# Patient Record
Sex: Male | Born: 1990 | Hispanic: Yes | Marital: Single | Smoking: Never smoker
Health system: Southern US, Community
[De-identification: ages and names within clinical notes are randomized; demographics above are authoritative.]

## PROBLEM LIST (undated history)

## (undated) DIAGNOSIS — F319 Bipolar disorder, unspecified: Secondary | ICD-10-CM

---

## 2019-01-20 ENCOUNTER — Emergency Department (HOSPITAL_COMMUNITY)
Admission: EM | Admit: 2019-01-20 | Discharge: 2019-01-21 | Disposition: A | Discharge status: Home / Self Care | Payer: Medicaid Other | Attending: Emergency Medicine | Admitting: Emergency Medicine

## 2019-01-20 ENCOUNTER — Other Ambulatory Visit: Payer: Self-pay

## 2019-01-20 ENCOUNTER — Ambulatory Visit (HOSPITAL_COMMUNITY)
Admission: RE | Admit: 2019-01-20 | Discharge: 2019-01-20 | Disposition: A | Discharge status: Home / Self Care | Payer: Medicaid Other | Attending: Psychiatry | Admitting: Psychiatry

## 2019-01-20 ENCOUNTER — Encounter (HOSPITAL_COMMUNITY): Payer: Self-pay | Admitting: Emergency Medicine

## 2019-01-20 DIAGNOSIS — R002 Palpitations: Secondary | ICD-10-CM | POA: Diagnosis not present

## 2019-01-20 DIAGNOSIS — Z046 Encounter for general psychiatric examination, requested by authority: Secondary | ICD-10-CM | POA: Diagnosis present

## 2019-01-20 DIAGNOSIS — F319 Bipolar disorder, unspecified: Secondary | ICD-10-CM | POA: Diagnosis not present

## 2019-01-20 DIAGNOSIS — R4689 Other symptoms and signs involving appearance and behavior: Secondary | ICD-10-CM | POA: Diagnosis not present

## 2019-01-20 DIAGNOSIS — F1994 Other psychoactive substance use, unspecified with psychoactive substance-induced mood disorder: Secondary | ICD-10-CM | POA: Diagnosis not present

## 2019-01-20 DIAGNOSIS — F419 Anxiety disorder, unspecified: Secondary | ICD-10-CM | POA: Insufficient documentation

## 2019-01-20 DIAGNOSIS — Z20828 Contact with and (suspected) exposure to other viral communicable diseases: Secondary | ICD-10-CM | POA: Diagnosis not present

## 2019-01-20 DIAGNOSIS — F39 Unspecified mood [affective] disorder: Secondary | ICD-10-CM | POA: Insufficient documentation

## 2019-01-20 HISTORY — DX: Bipolar disorder, unspecified: F31.9

## 2019-01-20 LAB — COMPREHENSIVE METABOLIC PANEL
ALT: 18 U/L (ref 0–44)
AST: 22 U/L (ref 15–41)
Albumin: 4.4 g/dL (ref 3.5–5.0)
Alkaline Phosphatase: 67 U/L (ref 38–126)
Anion gap: 11 (ref 5–15)
BUN: 25 mg/dL — ABNORMAL HIGH (ref 6–20)
CO2: 26 mmol/L (ref 22–32)
Calcium: 9.5 mg/dL (ref 8.9–10.3)
Chloride: 103 mmol/L (ref 98–111)
Creatinine, Ser: 1.02 mg/dL (ref 0.61–1.24)
GFR calc Af Amer: >60 mL/min (ref >60)
GFR calc non Af Amer: >60 mL/min (ref >60)
Glucose, Bld: 105 mg/dL — ABNORMAL HIGH (ref 70–99)
Potassium: 3.8 mmol/L (ref 3.5–5.1)
Sodium: 140 mmol/L (ref 135–145)
Total Bilirubin: 0.3 mg/dL (ref 0.3–1.2)
Total Protein: 7.7 g/dL (ref 6.5–8.1)

## 2019-01-20 LAB — CBC
HCT: 42.8 % (ref 39.0–52.0)
Hemoglobin: 13.9 g/dL (ref 13.0–17.0)
MCH: 29 pg (ref 26.0–34.0)
MCHC: 32.5 g/dL (ref 30.0–36.0)
MCV: 89.4 fL (ref 80.0–100.0)
Platelets: 293 10*3/uL (ref 150–400)
RBC: 4.79 MIL/uL (ref 4.22–5.81)
RDW: 12.8 % (ref 11.5–15.5)
WBC: 9.9 10*3/uL (ref 4.0–10.5)
nRBC: 0 % (ref 0.0–0.2)

## 2019-01-20 LAB — SARS CORONAVIRUS 2 BY RT PCR (HOSPITAL ORDER, PERFORMED IN ~~LOC~~ HOSPITAL LAB): SARS Coronavirus 2: NEGATIVE

## 2019-01-20 LAB — RAPID URINE DRUG SCREEN, HOSP PERFORMED
Amphetamines: NOT DETECTED
Barbiturates: NOT DETECTED
Benzodiazepines: NOT DETECTED
Cocaine: NOT DETECTED
Opiates: NOT DETECTED
Tetrahydrocannabinol: POSITIVE — AB

## 2019-01-20 LAB — ETHANOL: Alcohol, Ethyl (B): <10 mg/dL (ref <10)

## 2019-01-20 LAB — ACETAMINOPHEN LEVEL: Acetaminophen (Tylenol), Serum: <10 ug/mL — ABNORMAL LOW (ref 10–30)

## 2019-01-20 LAB — SALICYLATE LEVEL: Salicylate Lvl: <7 mg/dL (ref 2.8–30.0)

## 2019-01-20 MED ORDER — ALUM & MAG HYDROXIDE-SIMETH 200-200-20 MG/5ML PO SUSP: 30.0000 mL | Freq: Four times a day (QID) | ORAL | Status: DC | PRN

## 2019-01-20 MED ORDER — ACETAMINOPHEN 325 MG PO TABS: 650.0000 mg | ORAL_TABLET | ORAL | Status: DC | PRN

## 2019-01-20 MED ORDER — ONDANSETRON HCL 4 MG PO TABS: 4.0000 mg | ORAL_TABLET | Freq: Three times a day (TID) | ORAL | Status: DC | PRN

## 2019-01-20 NOTE — ED Provider Notes (Signed)
[facility] DEPT Provider Note   CSN: [id number] Arrival date & time: 01/20/19  1631    History   Chief Complaint Chief Complaint  Patient presents with  . Medical Clearance    HPI Morgon Pamer is a 28 y.o. male with history of bipolar 1 disorder presents sent from the behavioral health Hospital for evaluation of progressively worsening aggressive behavior and impulsivity.  He tells me that he overall feels "fine "but that he and his family members have been concerned that he has been having increasing issues with aggressive behavior and impulsivity.  He tells me that he has periods where he "blacks out "where he is angry and can sometimes harm people and cause damage to property.  He denies any overt suicidal or homicidal ideations.  Reports he does smoke marijuana to "self medicate" his anxiety and aggression and does not take any other medications.  He does not drink any alcohol.  He was seen and evaluated at behavioral health Hospital today and recommended for inpatient treatment sent to the ED for medical clearance and also placement as they do not have any beds available.  He denies any medical complaints including fever, cough, shortness of breath, chest pain, abdominal pain, nausea, or vomiting.  He does note that he will occasionally experience palpitations but this has been ongoing for several years and not worsening.     The history is provided by the patient.    Past Medical History:  Diagnosis Date  . Bipolar 1 disorder ([place])     There are no active problems to display for this patient.   History reviewed. No pertinent surgical history.      Home Medications    Prior to Admission medications   Not on File    Family History No family history on file.  Social History Social History   Tobacco Use  . Smoking status: Not on file  Substance Use Topics  . Alcohol use: Not on file  . Drug use: Not on file     Allergies    Patient has no known allergies.   Review of Systems Review of Systems  Constitutional: Negative for chills and fever.  Respiratory: Negative for cough and shortness of breath.   Cardiovascular: Positive for palpitations.  Gastrointestinal: Negative for abdominal pain, nausea and vomiting.  Psychiatric/Behavioral: Positive for behavioral problems and dysphoric mood. Negative for hallucinations, self-injury and suicidal ideas. The patient is nervous/anxious. The patient is not hyperactive.   All other systems reviewed and are negative.    Physical Exam Updated Vital Signs BP 123/82 (BP Location: Left Arm)   Pulse 63   Temp 98.9 F (37.2 C) (Oral)   Resp 16   SpO2 98%   Physical Exam Vitals signs and nursing note reviewed.  Constitutional:      General: He is not in acute distress.    Appearance: He is well-developed.     Comments: Resting in chair, dressed in purple scrubs  HENT:     Head: Normocephalic and atraumatic.  Eyes:     General:        Right eye: No discharge.        Left eye: No discharge.     Conjunctiva/sclera: Conjunctivae normal.  Neck:     Musculoskeletal: Normal range of motion and neck supple.     Vascular: No JVD.     Trachea: No tracheal deviation.  Cardiovascular:     Rate and Rhythm: Normal rate and regular rhythm.  Pulses: Normal pulses.     Heart sounds: Normal heart sounds.  Pulmonary:     Effort: Pulmonary effort is normal. No respiratory distress.     Breath sounds: Normal breath sounds. No wheezing.  Abdominal:     General: Abdomen is flat. Bowel sounds are normal. There is no distension.     Palpations: Abdomen is soft.     Tenderness: There is no abdominal tenderness. There is no guarding or rebound.  Skin:    General: Skin is warm and dry.     Findings: No erythema.  Neurological:     General: No focal deficit present.     Mental Status: He is alert.  Psychiatric:        Mood and Affect: Mood is elated. Affect is labile.         Speech: Speech normal.        Behavior: Behavior is cooperative.        Thought Content: Thought content does not include homicidal or suicidal ideation. Thought content does not include homicidal or suicidal plan.        Judgment: Judgment is impulsive.     Comments: Does not appear to be responding to internal stimuli      ED Treatments / Results  Labs (all labs ordered are listed, but only abnormal results are displayed) Labs Reviewed  COMPREHENSIVE METABOLIC PANEL - Abnormal; Notable for the following components:      Result Value   Glucose, Bld 105 (*)    BUN 25 (*)    All other components within normal limits  ACETAMINOPHEN LEVEL - Abnormal; Notable for the following components:   Acetaminophen (Tylenol), Serum <10 (*)    All other components within normal limits  RAPID URINE DRUG SCREEN, HOSP PERFORMED - Abnormal; Notable for the following components:   Tetrahydrocannabinol POSITIVE (*)    All other components within normal limits  SARS CORONAVIRUS 2 (HOSPITAL ORDER, PERFORMED IN [facility])  ETHANOL  SALICYLATE LEVEL  CBC    EKG None  Radiology No results found.  Procedures Procedures (including critical care time)  Medications Ordered in ED Medications  acetaminophen (TYLENOL) tablet 650 mg (has no administration in time range)  ondansetron (ZOFRAN) tablet 4 mg (has no administration in time range)  alum & mag hydroxide-simeth (MAALOX/MYLANTA) 200-200-20 MG/5ML suspension 30 mL (has no administration in time range)     Initial Impression / Assessment and Plan / ED Course  I have reviewed the triage vital signs and the nursing notes.  Pertinent labs & imaging results that were available during my care of the patient were reviewed by me and considered in my medical decision making (see chart for details).        [facility].  He was already seen and assessed and meets inpatient criteria there.   Is afebrile, vital signs are stable.  Physical examination is reassuring.  Screening labs reviewed by me show no leukocytosis, no anemia, no metabolic derangements.  [facility] which is consistent with the history that he gave me.  His COVID test is negative.  [facility].  Of note, patient is here voluntarily and may require IVC if he attempts to leave prior to formal psychiatric assessment. Final Clinical Impressions(s) / ED Diagnoses   Final diagnoses:  Aggressive behavior  Mood disorder [facility](HCC)    ED Discharge Orders    None  Jeanie SewerFawze, Tajon Moring A, PA-C 01/21/19 0143    Wynetta FinesMessick, Peter C, MD 01/21/19 1447

## 2019-01-20 NOTE — H&P (Signed)
Behavioral Health Medical Screening Exam  Nathaniel Holland is an 28 y.o. male who presents with bizarre behavior. He presents for mental evaluation and is seeking IOP treatment. Please see note from TTS regarding history.    Total Time spent with patient: 20 minutes  Psychiatric Specialty Exam: Physical Exam  ROS  Blood pressure 118/65, pulse 86, temperature 98.8 F (37.1 C), temperature source Oral, resp. rate 16, SpO2 98 %.There is no height or weight on file to calculate BMI.  General Appearance: Fairly Groomed  Eye Contact:  Fair  Speech:  Clear and Coherent and Normal Rate  Volume:  Normal  Mood:  Anxious  Affect:  Congruent  Thought Process:  Coherent, Linear and Descriptions of Associations: Intact  Orientation:  Full (Time, Place, and Person)  Thought Content:  Logical  Suicidal Thoughts:  No  Homicidal Thoughts:  No  Memory:  Immediate;   Fair Recent;   Fair  Judgement:  Fair  Insight:  Fair  Psychomotor Activity:  Normal  Concentration: Concentration: Fair and Attention Span: Fair  Recall:  [employer] of Knowledge:Fair  Language: Fair  Akathisia:  Negative  Handed:  Right  AIMS (if indicated):     Assets:  Communication Skills Desire for Improvement Financial Resources/Insurance Leisure Time Physical Health Transportation Vocational/Educational  Sleep:       Musculoskeletal: Strength & Muscle Tone: within normal limits Gait & Station: normal Patient leans: N/A  Blood pressure 118/65, pulse 86, temperature 98.8 F (37.1 C), temperature source Oral, resp. rate 16, SpO2 98 %.  Recommendations:  Based on my evaluation the patient does not appear to have an emergency medical condition. Will discharge and recommend IOP. Patient is provided resources.   Suella Broad, FNP 01/20/2019, 5:26 PM
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
## 2019-01-20 NOTE — Progress Notes (Signed)
Received Nathaniel Holland in his room at shift change, awake in bed watching TV. He was cooperative with the Covid test and his EKG. He made several phone calls and received a snack. He eventually drifted off to sleep and slept throughout the night.

## 2019-01-20 NOTE — ED Notes (Signed)
Patient is resting comfortably. 

## 2019-01-20 NOTE — ED Notes (Signed)
Pt has been seen and wand by security.   Pt has 1 bag of belongings. 

## 2019-01-20 NOTE — ED Triage Notes (Signed)
Pt sent from [facility] for medical clearance for in-patient bed which [facility] doesn't currently have a this time.  Pt reports that he was diagnosed with Bipolar and schizophrenia couple years ago and didn't want to take mediations so never did. Reports that tried to deal with the issues on his on, because he didn't think he had any. Pt reports that he has issues with impulsiveness. Denies SI or HI.

## 2019-01-21 ENCOUNTER — Encounter (HOSPITAL_COMMUNITY): Payer: Self-pay | Admitting: Registered Nurse

## 2019-01-21 DIAGNOSIS — F1994 Other psychoactive substance use, unspecified with psychoactive substance-induced mood disorder: Secondary | ICD-10-CM

## 2019-01-21 NOTE — Discharge Instructions (Signed)
Please contact the following agencies below for mental health treatment including medication management and therapy:   1. [facility] [address]     [place], [place] [address]     Phone: [phone]  2. [facility] [address]     [place], [place] [address]     Phone: [phone]

## 2019-01-21 NOTE — Progress Notes (Signed)
Pt meets inpatient criteria per Priscille Loveless, NP. Referral information has been sent to the following hospitals for review:  [facility] Details  [facility] Details  [facility] Details  [facility] Details  [facility] Details  [facility] Details  [facility] Details  [facility] Details  [facility] Details  [facility]  Disposition will continue to assist with inpatient placement needs.   Audree Camel, LCSW, [place] Disposition [facility] [phone]

## 2019-01-21 NOTE — ED Notes (Signed)
Pt discharged home. Discharged instructions read to pt who verbalized understanding. All belongings returned to pt. Denies SI/HI, is not delusional and not responding to internal stimuli. Escorted pt to the ED exit.   

## 2019-01-21 NOTE — Consult Note (Addendum)
[facility]  01/21/2019 2:15 PM Derinda LateCarlos Holland  MRN:  [id number] Principal Problem: Substance induced mood disorder [facility](HCC) Discharge Diagnoses: Principal Problem:   Substance induced mood disorder (HCC)   Subjective: Derinda Latearlos Klas, 28 y.o., male patient seen via tele psych by this provider, Dr. Sharma CovertNorman; and chart reviewed on 01/21/19.  On evaluation Derinda LateCarlos Poole reports he came to the hospital because "I want to talk to a doctor to find out what is going on with me.  It's not the fact of anger or mood swings.  Anyone around me can tell you something is wrong.  They tell me I'm crazy.  I feel like I'm pretty laid back; but when put in certain situations I react.  I have a tendency to do really bad things and I want to know why.  I would like to have a scan of my brain to see what is going own.  I don't hear or see thing or nothing like that.  I don't go around beating up people or nothing either."  Patient states that he smoke marijuana daily.  "I self medicate it helps me to calm down.  My psych person I was seen Ms. Angelique BlonderDenise told me it was better to use the marijuana instead of psych meds."  Patient states that he does have a history of psychiatric hospitalization can't remember how long ago and was started on psychotropics "But I didn't take none of that medicine.  I don't want to be dumb down by psych meds."  Patient states that he was diagnosed with depression and anxiety "may have been something else; I don't remember."  Patient states that he is currently living with his mother; recently moved here from [place].  I came down here seeking treatment.  I came here after getting out of jail for assault on a police officer.  States that he does not currently have any legal chagres.  Patient states that he is employed; when asked what type of work he did patient stated "It depends.  I feel that I prefer to work alone; I like to do isolated work when working for myself."  Patient never told what work he  did working for himself.  Patient denies suicidal/self-harm/homicidal ideation, psychosis, and paranoia.   During evaluation Derinda LateCarlos Purdon is alert/oriented x 4; calm/cooperative; and mood is congruent with affect.  He does not appear to be responding to internal/external stimuli or delusional thoughts.  Patient denies suicidal/self-harm/homicidal ideation, psychosis, and paranoia.  Patient answered question appropriately.  Informed patient about outpatient psychiatric services with psychiatrist and therapist to help with medication management and coping skills.  States that he is interested in outpatient psychiatric services.  Also discussed that daily use of marijuana could cause mood swings and encouraged to discontinue use.  Will order peer support and give resources for substance use services and psychiatric services.       Total Time spent with patient: 30 minutes  Past Psychiatric History: Denies prior suicide attempt; Anxiety, Depression  Past Medical History:  Past Medical History:  Diagnosis Date  . Bipolar 1 disorder (HCC)    History reviewed. No pertinent surgical history. Family History: History reviewed. No pertinent family history. Family Psychiatric  History: Denies Social History:  Social History   Substance and Sexual Activity  Alcohol Use None     Social History   Substance and Sexual Activity  Drug Use Not on file    Social History   Socioeconomic History  . Marital status:  Single    Spouse name: Not on file  . Number of children: Not on file  . Years of education: Not on file  . Highest education level: Not on file  Occupational History  . Not on file  Social Needs  . Financial resource strain: Not on file  . Food insecurity    Worry: Not on file    Inability: Not on file  . Transportation needs    Medical: Not on file    Non-medical: Not on file  Tobacco Use  . Smoking status: Not on file  Substance and Sexual Activity  . Alcohol use: Not on file   . Drug use: Not on file  . Sexual activity: Not on file  Lifestyle  . Physical activity    Days per week: Not on file    Minutes per session: Not on file  . Stress: Not on file  Relationships  . Social Herbalist on phone: Not on file    Gets together: Not on file    Attends religious service: Not on file    Active member of club or organization: Not on file    Attends meetings of clubs or organizations: Not on file    Relationship status: Not on file  Other Topics Concern  . Not on file  Social History Narrative  . Not on file    Has this patient used any form of tobacco in the last 30 days? (Cigarettes, Smokeless Tobacco, Cigars, and/or Pipes) A prescription for an FDA-approved tobacco cessation medication was offered at discharge and the patient refused  Current Medications: Current Facility-Administered Medications  Medication Dose Route Frequency Provider Last Rate Last Dose  . acetaminophen (TYLENOL) tablet 650 mg  650 mg Oral Q4H PRN Fawze, Mina A, PA-C      . alum & mag hydroxide-simeth (MAALOX/MYLANTA) 200-200-20 MG/5ML suspension 30 mL  30 mL Oral Q6H PRN Fawze, Mina A, PA-C      . ondansetron (ZOFRAN) tablet 4 mg  4 mg Oral Q8H PRN Fawze, Mina A, PA-C       No current outpatient medications on file.   PTA Medications: (Not in a hospital admission)   Musculoskeletal: Strength & Muscle Tone: within normal limits Gait & Station: normal Patient leans: N/A  Psychiatric Specialty Exam: Physical Exam  Nursing note and vitals reviewed. Constitutional: [name], place, and time. No distress.  Neck: Normal range of motion.  Respiratory: Effort normal.  Musculoskeletal: Normal range of motion.  Neurological: He is alert and oriented to person, place, and time.  Psychiatric: His behavior is normal. Judgment and thought content normal. Anxious: Stable. Cognition and memory are normal. Depressed: Stable.    Review of Systems   Psychiatric/Behavioral: Depression: Stable. Hallucinations: Denies. Memory loss: Denies. Substance abuse: THC daily. Suicidal ideas: Denies. Nervous/anxious: Denies. Insomnia: Denies.   All other systems reviewed and are negative.   Blood pressure 117/80, pulse 65, temperature 98.8 F (37.1 C), temperature source Oral, resp. rate 18, SpO2 99 %.There is no height or weight on file to calculate BMI.  General Appearance: Casual  Eye Contact:  Good  Speech:  Clear and Coherent and Normal Rate  Volume:  Normal  Mood:  "I'm Good"  Appropriate  Affect:  Appropriate and Congruent  Thought Process:  Coherent, Goal Directed and Descriptions of Associations: Intact  Orientation:  Full (Time, Place, and Person)  Thought Content:  WDL  Suicidal Thoughts:  No  Homicidal Thoughts:  No  Memory:  Immediate;   Good Recent;   Good Remote;   Good  Judgement:  Intact  Insight:  Present  Psychomotor Activity:  Normal  Concentration:  Concentration: Good and Attention Span: Good  Recall:  Good  Fund of Knowledge:  Fair  Language:  Good  Akathisia:  No  Handed:  Right  AIMS (if indicated):   N/A  Assets:  Communication Skills Desire for Improvement Housing Physical Health Social Support  ADL's:  Intact  Cognition:  WNL  Sleep:   N/A     Demographic Factors:  Male  Loss Factors: NA  Historical Factors: NA  Risk Reduction Factors:   Sense of responsibility to family, Living with another person, especially a relative and Positive social support  Continued Clinical Symptoms:  Alcohol/Substance Abuse/Dependencies  Cognitive Features That Contribute To Risk:  None    Suicide Risk:  Minimal: No identifiable suicidal ideation.  Patients presenting with no risk factors but with morbid ruminations; may be classified as minimal risk based on the severity of the depressive symptoms    Plan Of Care/Follow-up recommendations:  Activity:  As tolerated Diet:  Heart healthy Other:  Follow  up with resources given   Disposition: No evidence of imminent risk to self or others at present.   Patient does not meet criteria for psychiatric inpatient admission. Supportive therapy provided about ongoing stressors. Refer to IOP. Discussed crisis plan, support from social network, calling 911, coming to the Emergency Department, and calling Suicide Hotline.  Shuvon Rankin, NP 01/21/2019, 2:15 PM    Patient seen by telemedicine for psychiatric evaluation, chart reviewed and case discussed with the physician extender and developed treatment plan. Reviewed the information documented and agree with the treatment plan.  Juanetta BeetsJacqueline Khy Pitre, DO 01/21/19 5:09 PM

## 2019-04-04 ENCOUNTER — Emergency Department (HOSPITAL_COMMUNITY)
Admission: EM | Admit: 2019-04-04 | Discharge: 2019-04-07 | Disposition: A | Discharge status: Other Acute Inpatient Hospital | Payer: Medicaid Other | Attending: Emergency Medicine | Admitting: Emergency Medicine

## 2019-04-04 ENCOUNTER — Emergency Department (HOSPITAL_COMMUNITY): Payer: Medicaid Other

## 2019-04-04 DIAGNOSIS — F1020 Alcohol dependence, uncomplicated: Secondary | ICD-10-CM | POA: Diagnosis not present

## 2019-04-04 DIAGNOSIS — Z046 Encounter for general psychiatric examination, requested by authority: Secondary | ICD-10-CM | POA: Diagnosis not present

## 2019-04-04 DIAGNOSIS — F315 Bipolar disorder, current episode depressed, severe, with psychotic features: Secondary | ICD-10-CM | POA: Diagnosis not present

## 2019-04-04 DIAGNOSIS — R112 Nausea with vomiting, unspecified: Secondary | ICD-10-CM | POA: Diagnosis not present

## 2019-04-04 DIAGNOSIS — M25511 Pain in right shoulder: Secondary | ICD-10-CM | POA: Insufficient documentation

## 2019-04-04 DIAGNOSIS — Z20828 Contact with and (suspected) exposure to other viral communicable diseases: Secondary | ICD-10-CM | POA: Insufficient documentation

## 2019-04-04 DIAGNOSIS — F1010 Alcohol abuse, uncomplicated: Secondary | ICD-10-CM

## 2019-04-04 DIAGNOSIS — R4689 Other symptoms and signs involving appearance and behavior: Secondary | ICD-10-CM | POA: Diagnosis present

## 2019-04-04 DIAGNOSIS — R079 Chest pain, unspecified: Secondary | ICD-10-CM | POA: Diagnosis not present

## 2019-04-04 DIAGNOSIS — R45851 Suicidal ideations: Secondary | ICD-10-CM | POA: Insufficient documentation

## 2019-04-04 LAB — CBC WITH DIFFERENTIAL/PLATELET
Abs Immature Granulocytes: 0.08 10*3/uL — ABNORMAL HIGH (ref 0.00–0.07)
Basophils Absolute: 0.1 10*3/uL (ref 0.0–0.1)
Basophils Relative: 1 %
Eosinophils Absolute: 0 10*3/uL (ref 0.0–0.5)
Eosinophils Relative: 0 %
HCT: 46.4 % (ref 39.0–52.0)
Hemoglobin: 15.6 g/dL (ref 13.0–17.0)
Immature Granulocytes: 1 %
Lymphocytes Relative: 7 %
Lymphs Abs: 1.1 10*3/uL (ref 0.7–4.0)
MCH: 30.3 pg (ref 26.0–34.0)
MCHC: 33.6 g/dL (ref 30.0–36.0)
MCV: 90.1 fL (ref 80.0–100.0)
Monocytes Absolute: 1.1 10*3/uL — ABNORMAL HIGH (ref 0.1–1.0)
Monocytes Relative: 7 %
Neutro Abs: 12.8 10*3/uL — ABNORMAL HIGH (ref 1.7–7.7)
Neutrophils Relative %: 84 %
Platelets: 307 10*3/uL (ref 150–400)
RBC: 5.15 MIL/uL (ref 4.22–5.81)
RDW: 13.4 % (ref 11.5–15.5)
WBC: 15.2 10*3/uL — ABNORMAL HIGH (ref 4.0–10.5)
nRBC: 0 % (ref 0.0–0.2)

## 2019-04-04 MED ORDER — SODIUM CHLORIDE 0.9 % IV BOLUS (SEPSIS)
1000.0000 mL | Freq: Once | INTRAVENOUS | Status: AC
  Administered 2019-04-04: 1000 mL via INTRAVENOUS

## 2019-04-04 MED ORDER — ONDANSETRON HCL 4 MG/2ML IJ SOLN
4.0000 mg | Freq: Once | INTRAMUSCULAR | Status: AC
  Administered 2019-04-04: 4 mg via INTRAVENOUS
  Filled 2019-04-04: qty 2

## 2019-04-04 NOTE — ED Triage Notes (Signed)
Came in via ems; accompanied by [employer]. Reported suicidal ideation, and was aggressive on scene. Reported pt was unresponsive on scene and had a couple of chest compressions. Pt c/o chest pain afterwards. Pt reported hx of irregular heartbeat.

## 2019-04-04 NOTE — ED Provider Notes (Signed)
[facility] EMERGENCY DEPARTMENT Provider Note   CSN: [id number] Arrival date & time: 04/04/19  2259     History   Chief Complaint Chief Complaint  Patient presents with  . Psychiatric Evaluation   Level 5 caveat due to psychiatric condition HPI Nathaniel Holland is a 28 y.o. male.     The history is provided by the patient.  Patient presents with [facility]. Patient admits to alcohol use tonight.  Apparently the sheriff was called tonight and patient became aggressive.  He attempted to run away.  After he was apprehended he was put in the car and driven away.  Sheriff reports the patient was unresponsive and he was pulled out of the car.  They report he was not breathing and they started CPR and he woke up immediately.  Sheriff tells me they never checked his pulse. Since that time patient is reporting chest pain.  He is also reporting he has to vomit. Patient reports he has a history of irregular heartbeat. He reports he has a history of mental health disorder but is  unsure what that is and what medications he is supposed to be taking   Past Medical History:  Diagnosis Date  . Bipolar 1 disorder [facility])     Patient Active Problem List   Diagnosis Date Noted  . Substance induced mood disorder (HCC) 01/21/2019    No past surgical history on file.      Home Medications    Prior to Admission medications   Not on File    Family History No family history on file.  Social History Social History   Tobacco Use  . Smoking status: Not on file  Substance Use Topics  . Alcohol use: Not on file  . Drug use: Not on file     Allergies   Patient has no known allergies.   Review of Systems Review of Systems  Unable to perform ROS: Psychiatric disorder  Constitutional: Negative for fever.  Respiratory: Negative for cough.   Cardiovascular: Positive for chest pain.  Gastrointestinal: Positive for nausea and vomiting.   Psychiatric/Behavioral: The patient is nervous/anxious.      Physical Exam Updated Vital Signs Ht 1.676 m (5\' 6" )   Wt 63.5 kg   BMI 22.60 kg/m   Physical Exam CONSTITUTIONAL: Disheveled, patient leaning forward due to his arms handcuffed behind his back HEAD: Normocephalic/atraumatic, mild tenderness to right side of the scalp with no bruising or step-offs EYES: EOMI/PERRL ENMT: Mucous membranes moist, no visible trauma NECK: supple no meningeal signs SPINE/BACK:entire spine nontender CV: S1/S2 noted, no murmurs/rubs/gallops noted LUNGS: Lungs are clear to auscultation bilaterally, no apparent distress Chest-mild tenderness, no crepitus ABDOMEN: soft, nontender, no rebound or guarding, bowel sounds noted throughout abdomen GU:no cva tenderness NEURO: Pt is awake/alert/appropriate, moves all extremitiesx4.  No facial droop.   EXTREMITIES: pulses normal/equal, full ROM, no deformities SKIN: warm, color normal PSYCH: Mildly anxious   ED Treatments / Results  Labs (all labs ordered are listed, but only abnormal results are displayed) Labs Reviewed  CBC WITH DIFFERENTIAL/PLATELET - Abnormal; Notable for the following components:      Result Value   WBC 15.2 (*)    Neutro Abs 12.8 (*)    Monocytes Absolute 1.1 (*)    Abs Immature Granulocytes 0.08 (*)    All other components within normal limits  COMPREHENSIVE METABOLIC PANEL - Abnormal; Notable for the following components:   CO2 20 (*)    Glucose, Bld 100 (*)  All other components within normal limits  ACETAMINOPHEN LEVEL - Abnormal; Notable for the following components:   Acetaminophen (Tylenol), Serum <10 (*)    All other components within normal limits  RAPID URINE DRUG SCREEN, HOSP PERFORMED - Abnormal; Notable for the following components:   Tetrahydrocannabinol POSITIVE (*)    All other components within normal limits  ETHANOL - Abnormal; Notable for the following components:   Alcohol, Ethyl (B) 133 (*)     All other components within normal limits  SARS CORONAVIRUS 2 BY RT PCR (HOSPITAL ORDER, PERFORMED IN [facility])  SALICYLATE LEVEL  TROPONIN I (HIGH SENSITIVITY)  TROPONIN I (HIGH SENSITIVITY)    EKG EKG Interpretation  Date/Time:  Saturday April 04 2019 23:09:41 EDT Ventricular Rate:  93 PR Interval:    QRS Duration: 84 QT Interval:  338 QTC Calculation: 421 R Axis:   21 Text Interpretation: Sinus rhythm ST elev, probable normal early repol pattern No significant change since last tracing Confirmed by Zadie RhineWickline, Iram Astorino ([id number]) on 04/04/2019 11:26:02 PM   Radiology Dg Chest Port 1 View  Result Date: 04/04/2019 CLINICAL DATA:  28 year old male with suicidal ideation and chest pain. EXAM: PORTABLE CHEST 1 VIEW COMPARISON:  None. FINDINGS: The lungs are clear. There is no pleural effusion or pneumothorax. The cardiac silhouette is within normal limits. No acute osseous pathology. IMPRESSION: No active disease. Electronically Signed   By: Elgie CollardArash  Radparvar M.D.   On: 04/04/2019 23:50    Procedures Procedures  Medications Ordered in ED Medications  acetaminophen (TYLENOL) tablet 650 mg (has no administration in time range)  ondansetron (ZOFRAN) injection 4 mg (4 mg Intravenous Given 04/04/19 2346)  sodium chloride 0.9 % bolus 1,000 mL (0 mLs Intravenous Stopped 04/05/19 0109)  sodium chloride 0.9 % bolus 1,000 mL (0 mLs Intravenous Stopped 04/05/19 0200)     Initial Impression / Assessment and Plan / ED Course  I have reviewed the triage vital signs and the nursing notes.  Pertinent labs & imaging results that were available during my care of the patient were reviewed by me and considered in my medical decision making (see chart for details).        11:39 PM [place] [facility] [place] [facility]  Apparently he was becoming aggressive after drinking alcohol. There was reports that he was unresponsive but woke up immediately after CPR.   Deputy sheriff reports there was no pulse check before/after CPR compressions started Patient is now awake and alert.  His EKG is unchanged   I had a long conversation with his mother.  She reports since she returned from [place] last week, he has been drinking alcohol every day.  Tonight he was drinking a lot of alcohol and became aggressive.  He has reported suicidal threats per mother.  She reports she has not been compliant medications.  She reports his doctors in [place] requested he have counseling.  At this point will obtain labs and chest x-ray.  Patient has a GCS of 15 and no signs of obvious head trauma.  Will defer CT head for now.  No other signs of acute trauma Once patient is medically stable, will consult psychiatry 3:17 AM Patient remained stable.  Patient is dehydrated, with evidence of substance abuse but overall labs reassuring. He is awake/alert at this time.  He is medically stable for psychiatry Final Clinical Impressions(s) / ED Diagnoses   Final diagnoses:  Suicidal ideation  Alcohol abuse    ED Discharge Orders  None       Ripley Fraise, MD 04/05/19 [phone]

## 2019-04-05 ENCOUNTER — Other Ambulatory Visit: Payer: Self-pay

## 2019-04-05 ENCOUNTER — Emergency Department (HOSPITAL_COMMUNITY): Payer: Medicaid Other

## 2019-04-05 LAB — COMPREHENSIVE METABOLIC PANEL
ALT: 19 U/L (ref 0–44)
AST: 25 U/L (ref 15–41)
Albumin: 4.5 g/dL (ref 3.5–5.0)
Alkaline Phosphatase: 82 U/L (ref 38–126)
Anion gap: 15 (ref 5–15)
BUN: 11 mg/dL (ref 6–20)
CO2: 20 mmol/L — ABNORMAL LOW (ref 22–32)
Calcium: 9.3 mg/dL (ref 8.9–10.3)
Chloride: 102 mmol/L (ref 98–111)
Creatinine, Ser: 0.99 mg/dL (ref 0.61–1.24)
GFR calc Af Amer: >60 mL/min (ref >60)
GFR calc non Af Amer: >60 mL/min (ref >60)
Glucose, Bld: 100 mg/dL — ABNORMAL HIGH (ref 70–99)
Potassium: 4 mmol/L (ref 3.5–5.1)
Sodium: 137 mmol/L (ref 135–145)
Total Bilirubin: 0.5 mg/dL (ref 0.3–1.2)
Total Protein: 7.8 g/dL (ref 6.5–8.1)

## 2019-04-05 LAB — SALICYLATE LEVEL: Salicylate Lvl: <7 mg/dL (ref 2.8–30.0)

## 2019-04-05 LAB — RAPID URINE DRUG SCREEN, HOSP PERFORMED
Amphetamines: NOT DETECTED
Barbiturates: NOT DETECTED
Benzodiazepines: NOT DETECTED
Cocaine: NOT DETECTED
Opiates: NOT DETECTED
Tetrahydrocannabinol: POSITIVE — AB

## 2019-04-05 LAB — SARS CORONAVIRUS 2 BY RT PCR (HOSPITAL ORDER, PERFORMED IN ~~LOC~~ HOSPITAL LAB): SARS Coronavirus 2: NEGATIVE

## 2019-04-05 LAB — ACETAMINOPHEN LEVEL: Acetaminophen (Tylenol), Serum: <10 ug/mL — ABNORMAL LOW (ref 10–30)

## 2019-04-05 LAB — TROPONIN I (HIGH SENSITIVITY)
Troponin I (High Sensitivity): 4 ng/L (ref <18)
Troponin I (High Sensitivity): 6 ng/L (ref <18)

## 2019-04-05 LAB — ETHANOL: Alcohol, Ethyl (B): 133 mg/dL — ABNORMAL HIGH (ref <10)

## 2019-04-05 MED ORDER — ACETAMINOPHEN 325 MG PO TABS
650.0000 mg | ORAL_TABLET | ORAL | Status: DC | PRN
  Administered 2019-04-05 – 2019-04-07 (×3): 650 mg via ORAL
  Filled 2019-04-05 (×3): qty 2

## 2019-04-05 MED ORDER — SODIUM CHLORIDE 0.9 % IV BOLUS (SEPSIS)
1000.0000 mL | Freq: Once | INTRAVENOUS | Status: AC
  Administered 2019-04-05: 02:00:00 1000 mL via INTRAVENOUS

## 2019-04-05 NOTE — ED Notes (Signed)
Pt returned from xray

## 2019-04-05 NOTE — Progress Notes (Signed)
Pt meets inpatient criteria per Lindon Romp, NP. Referral information has been sent to the following hospitals for review:  [facility]      Disposition will continue to assist with inpatient placement needs.   Audree Camel, LCSW, [place] Disposition [facility]/TTS [phone]

## 2019-04-05 NOTE — ED Notes (Signed)
Pt given drink per request

## 2019-04-05 NOTE — ED Notes (Signed)
Pt transported to x-ray via w/c. Sitter w/pt.

## 2019-04-05 NOTE — ED Notes (Signed)
Copy of IVC paperwork faxed to [facility] - Copy sent to Medical Records - Original placed in folder for Magistrate - ALL 3 sets on clipboard.

## 2019-04-05 NOTE — ED Notes (Signed)
Pt arrived to Rm 48 - ambulatory - wearing burgundy scrub top. Pt noted to be calm, cooperative. Pt voiced understanding of Medical Clearance Pt Policy form. Pt denies SI/HI and AVH. TV turned on for pt as requested.

## 2019-04-05 NOTE — ED Notes (Signed)
Breakfast ordered 

## 2019-04-05 NOTE — ED Notes (Signed)
Pt c/o right shoulder pain and limited movement. Dr Roderic Palau aware - order received for right shoulder x-ray. Pt aware.

## 2019-04-05 NOTE — ED Notes (Signed)
Ice pack applied to right shoulder for comfort. Pt talking on phone at nurses' desk.

## 2019-04-05 NOTE — ED Notes (Signed)
Pt ambulatory to restroom with steady gait.
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
## 2019-04-06 MED ORDER — HYDROXYZINE HCL 25 MG PO TABS
25.0000 mg | ORAL_TABLET | Freq: Once | ORAL | Status: AC
  Administered 2019-04-06: 12:00:00 25 mg via ORAL
  Filled 2019-04-06: qty 1

## 2019-04-06 MED ORDER — TRAZODONE HCL 50 MG PO TABS
50.0000 mg | ORAL_TABLET | Freq: Every day | ORAL | Status: DC
  Administered 2019-04-07: 01:00:00 50 mg via ORAL
  Filled 2019-04-06: qty 1

## 2019-04-06 MED ORDER — LORAZEPAM 1 MG PO TABS
1.0000 mg | ORAL_TABLET | Freq: Once | ORAL | Status: AC
  Administered 2019-04-06: 08:00:00 1 mg via ORAL
  Filled 2019-04-06: qty 1

## 2019-04-06 MED ORDER — OLANZAPINE 5 MG PO TABS
5.0000 mg | ORAL_TABLET | Freq: Every day | ORAL | Status: DC
  Administered 2019-04-06 – 2019-04-07 (×2): 5 mg via ORAL
  Filled 2019-04-06 (×2): qty 1

## 2019-04-06 NOTE — ED Notes (Signed)
[facility]-Inpt  

## 2019-04-06 NOTE — BH Assessment (Signed)
04/06/19: Seen by Tinnie Gens- cont inpatient. Re-faxed updated labs and clinicals to the following hospitals. Patient under Review: [facility]  [place]  [facility]  [place]  [facility]

## 2019-04-06 NOTE — ED Notes (Signed)
Breakfast Ordered 

## 2019-04-06 NOTE — Progress Notes (Addendum)
Patient ID: Nathaniel Holland, male   DOB: 1990-07-14, 28 y.o.   MRN: [id number]   Reassessment  Patient reassessed following request for psychiatric consult. Patient initially presented to the ED being petitioned for involuntary commitment by Nathaniel Holland, [employer]. Affidavit and Petition states: "Respondent is suicidal and is hearing voices. Respondent has a history of mental health commitment. Respondent has become very aggressive and hostile. Respondent believes people are out to get him. Respondent has been self medicating."  During this evaluation patient is alert and oriented. You can identify that he becomes easily irritable although he is cooperative. He reports he was approached by law enforcement yesterday and initially, he ran. Reports he thought about it as he knew he had not done anything wrong so he went back and at that time, he felt like law enforcement  were coming at him aggressively so he attempted to run although he was wrestled to the ground, restrained and put in a police vehicle. When asked why did he think law enforcement approached him, he reports earlier that day, he called the crisis hotline because he had been struggling with himself. Reports he told the crisis hotline," I am going to get someone else before they get me." He reports he has been struggling with, " off and on" suicidal thoughts although he has no intent as he loves his family and kids. He admits that he struggle with anger issues as well as paranoia thinking that someone is always trying to get him or against him. He denies AVH. He reports last month he was psychiatrically hospitalized  in [place] and he was to start medication although he did not start the medication when he was discharged because he could not find his ID. He endorses lately, he has been drinking alcohol more and on the ay of the incident, he drank a few glasses of wine. He admits to smoking mariajuana to self  medicated to help him with sleep and anxiety. He endorses poor sleep (sleeping for 3 hours per night and that's only if he smokes marijuana otherwise, he reports not sleeping). He states," I know I need help because things at this point are uncontrollable." He reports he is willing to go to a mental health facility to receive help.  Reports receiving outpatient therapy at [place],   Based on this evaluation along with previous collected  collateral information form guardian, I am continuing to recommend inpatient psychiatric hospitalization. I spoke with his mother with verbal concent  And she reports that patient was prescribed Zyprexa last month when hospitalized in [place]. I am recommending that Zyprexa 5 mg po daily be started as well as Trazodone 50 mg po daily at bedtime as needed for sleep and Vistaril 25 mg po TID as needed for anxiety. For agitation, I am recommending an agitation protocol; Geodon 20 mg po or IM q12 horus and Ativan 1 mg po. EKG reviewed and there are no concerns with Qtc interval.  Attest to NP progress note

## 2019-04-06 NOTE — ED Notes (Signed)
Pt informs sitter that he wants to speak to nurse, this nurse in pt room, pt presents with increase in anxiety, reports he is angry and sad. Tells this nurse "I have problems, I am at war with myself." Reports he has been fighting this feeling for hours, but he continues to feel worse, this nurse notified EDP of pt current behaviors.

## 2019-04-06 NOTE — ED Notes (Signed)
This nurse notified psych NP Caryl Ada of pt behaviors.

## 2019-04-06 NOTE — ED Notes (Signed)
Ordered diet tray for pt  

## 2019-04-06 NOTE — ED Notes (Signed)
Pt taking a shower 

## 2019-04-06 NOTE — ED Notes (Signed)
Tele psych machine at bedside 

## 2019-04-06 NOTE — ED Provider Notes (Signed)
Behavioral health note reviewed the nurse practitioner recommends Zyprexa, trazodone, Vistaril.  These medications were not ordered by behavioral health, they will be ordered here at their recommendation.   Okey Regal, PA-C 04/06/19 1138    Carmin Muskrat, MD 04/06/19 [phone]

## 2019-04-07 ENCOUNTER — Inpatient Hospital Stay (HOSPITAL_COMMUNITY)
Admission: AD | Admit: 2019-04-07 | Discharge: 2019-04-10 | DRG: 897 | Disposition: A | Discharge status: Home / Self Care | Payer: Medicaid Other | Attending: Psychiatry | Admitting: Psychiatry

## 2019-04-07 ENCOUNTER — Other Ambulatory Visit: Payer: Self-pay

## 2019-04-07 ENCOUNTER — Encounter (HOSPITAL_COMMUNITY): Payer: Self-pay

## 2019-04-07 DIAGNOSIS — F12259 Cannabis dependence with psychotic disorder, unspecified: Secondary | ICD-10-CM | POA: Diagnosis present

## 2019-04-07 DIAGNOSIS — R45851 Suicidal ideations: Secondary | ICD-10-CM | POA: Diagnosis present

## 2019-04-07 DIAGNOSIS — F209 Schizophrenia, unspecified: Secondary | ICD-10-CM | POA: Diagnosis present

## 2019-04-07 DIAGNOSIS — F1994 Other psychoactive substance use, unspecified with psychoactive substance-induced mood disorder: Secondary | ICD-10-CM | POA: Diagnosis present

## 2019-04-07 DIAGNOSIS — F19959 Other psychoactive substance use, unspecified with psychoactive substance-induced psychotic disorder, unspecified: Secondary | ICD-10-CM | POA: Diagnosis not present

## 2019-04-07 DIAGNOSIS — F29 Unspecified psychosis not due to a substance or known physiological condition: Secondary | ICD-10-CM

## 2019-04-07 DIAGNOSIS — I10 Essential (primary) hypertension: Secondary | ICD-10-CM | POA: Diagnosis present

## 2019-04-07 DIAGNOSIS — F10229 Alcohol dependence with intoxication, unspecified: Secondary | ICD-10-CM

## 2019-04-07 DIAGNOSIS — F250 Schizoaffective disorder, bipolar type: Secondary | ICD-10-CM | POA: Diagnosis not present

## 2019-04-07 DIAGNOSIS — R440 Auditory hallucinations: Secondary | ICD-10-CM | POA: Diagnosis present

## 2019-04-07 DIAGNOSIS — F12222 Cannabis dependence with intoxication with perceptual disturbance: Secondary | ICD-10-CM | POA: Diagnosis not present

## 2019-04-07 DIAGNOSIS — G4700 Insomnia, unspecified: Secondary | ICD-10-CM | POA: Diagnosis present

## 2019-04-07 DIAGNOSIS — Z7289 Other problems related to lifestyle: Secondary | ICD-10-CM

## 2019-04-07 DIAGNOSIS — F315 Bipolar disorder, current episode depressed, severe, with psychotic features: Secondary | ICD-10-CM | POA: Diagnosis not present

## 2019-04-07 MED ORDER — LORAZEPAM 1 MG PO TABS: 1.0000 mg | ORAL_TABLET | Freq: Four times a day (QID) | ORAL | Status: DC | PRN

## 2019-04-07 MED ORDER — ZIPRASIDONE MESYLATE 20 MG IM SOLR: 20.0000 mg | INTRAMUSCULAR | Status: DC | PRN

## 2019-04-07 MED ORDER — ZIPRASIDONE MESYLATE 20 MG IM SOLR
20.0000 mg | Freq: Once | INTRAMUSCULAR | Status: DC
  Filled 2019-04-07: qty 20

## 2019-04-07 MED ORDER — TRAZODONE HCL 100 MG PO TABS
100.0000 mg | ORAL_TABLET | Freq: Every evening | ORAL | Status: DC | PRN
  Administered 2019-04-07 – 2019-04-09 (×3): 100 mg via ORAL
  Filled 2019-04-07 (×2): qty 1
  Filled 2019-04-07: qty 7

## 2019-04-07 MED ORDER — HYDROXYZINE HCL 25 MG PO TABS
25.0000 mg | ORAL_TABLET | Freq: Three times a day (TID) | ORAL | Status: DC | PRN
  Administered 2019-04-07 – 2019-04-09 (×5): 25 mg via ORAL
  Filled 2019-04-07: qty 1
  Filled 2019-04-07: qty 10
  Filled 2019-04-07 (×4): qty 1

## 2019-04-07 MED ORDER — LORAZEPAM 1 MG PO TABS: 1.0000 mg | ORAL_TABLET | ORAL | Status: DC | PRN

## 2019-04-07 MED ORDER — OLANZAPINE 5 MG PO TABS
5.0000 mg | ORAL_TABLET | Freq: Every day | ORAL | Status: DC
  Filled 2019-04-07 (×2): qty 1

## 2019-04-07 MED ORDER — STERILE WATER FOR INJECTION IJ SOLN
INTRAMUSCULAR | Status: AC
  Filled 2019-04-07: qty 10

## 2019-04-07 MED ORDER — FOLIC ACID 1 MG PO TABS
1.0000 mg | ORAL_TABLET | Freq: Every day | ORAL | Status: DC
  Administered 2019-04-07 – 2019-04-10 (×4): 1 mg via ORAL
  Filled 2019-04-07 (×7): qty 1

## 2019-04-07 MED ORDER — VITAMIN B-1 100 MG PO TABS
100.0000 mg | ORAL_TABLET | Freq: Every day | ORAL | Status: DC
  Administered 2019-04-07 – 2019-04-10 (×4): 100 mg via ORAL
  Filled 2019-04-07 (×7): qty 1

## 2019-04-07 MED ORDER — OLANZAPINE 10 MG PO TBDP
10.0000 mg | ORAL_TABLET | Freq: Three times a day (TID) | ORAL | Status: DC | PRN
  Administered 2019-04-08: 10 mg via ORAL
  Filled 2019-04-07 (×2): qty 1

## 2019-04-07 MED ORDER — ZIPRASIDONE MESYLATE 20 MG IM SOLR: 20.0000 mg | Freq: Two times a day (BID) | INTRAMUSCULAR | Status: DC | PRN

## 2019-04-07 MED ORDER — OMEGA-3-ACID ETHYL ESTERS 1 G PO CAPS
1.0000 g | ORAL_CAPSULE | Freq: Two times a day (BID) | ORAL | Status: DC
  Administered 2019-04-07 – 2019-04-10 (×6): 1 g via ORAL
  Filled 2019-04-07 (×10): qty 1

## 2019-04-07 MED ORDER — LORAZEPAM 1 MG PO TABS
1.0000 mg | ORAL_TABLET | Freq: Once | ORAL | Status: AC
  Administered 2019-04-07: 1 mg via ORAL
  Filled 2019-04-07: qty 1

## 2019-04-07 MED ORDER — TRAZODONE HCL 50 MG PO TABS: 50.0000 mg | ORAL_TABLET | Freq: Every evening | ORAL | Status: DC | PRN

## 2019-04-07 MED ORDER — RISPERIDONE 2 MG PO TABS
2.0000 mg | ORAL_TABLET | Freq: Two times a day (BID) | ORAL | Status: DC
  Administered 2019-04-07 – 2019-04-08 (×2): 2 mg via ORAL
  Filled 2019-04-07 (×6): qty 1

## 2019-04-07 NOTE — BHH Suicide Risk Assessment (Signed)
[facility] Suicide Risk [facility]   Nursing information obtained from:  [name]:  Male, Low socioeconomic status, Unemployed, Adolescent or young adult, Caucasian Current Mental Status:  Suicidal ideation indicated by patient, Self-harm thoughts Loss Factors:  Decline in physical health, Legal issues, Financial problems / change in socioeconomic status Historical Factors:  Family history of mental illness or substance abuse, Impulsivity Risk Reduction Factors:  Positive coping skills or problem solving skills, Responsible for children under 30 years of age, Living with another person, especially a relative, Sense of responsibility to family, Positive social support, Positive therapeutic relationship  Total Time spent with patient: 30 minutes Principal Problem: <principal problem not specified> Diagnosis:  Active Problems:   Psychotic disorder (HCC)  Subjective Data: Patient is seen and examined.  Patient is a 28 year old male with an unspecified past psychiatric history who was originally brought to the [facility] emergency department on 04/05/2019 under involuntary commitment.  The patient was reported to be suicidal, having auditory hallucinations, becoming aggressive and hostile.  There was concern for self-medication.  The patient did admit that he had been smoking marijuana.  The patient story is quite convoluted.  Patient stated that he had been using ecstasy for a while, but had not used it recently.  He stated he basically uses marijuana on a daily basis.  [name], [name].  He was there for approximately 8 years.  He has a significant other there who has at least 2 children by him.  Somehow or another he has 2 charges on him therefore is assault on a male which apparently was dismissed, and also a charge of assaulting a Emergency planning/management officerpolice officer.  He admitted that he felt paranoid and worried about people following him.  He stated he had seen  information that made him know that people were after him.  He did admit to auditory hallucinations, but sometimes "sometimes I think it is just in my head".  After these complicated issues he stated [place] went to [place] arrested there.  He was given his an unspecified psychiatric medication there thought to be Zyprexa.  He was given a prescription, but he stated he never filled it.  [name].  He stated his mother and stepfather live here.  He came here couple months ago.  [name], and his blood alcohol on admission was 133.  He stated he uses marijuana almost on a daily basis.  [name] he had used cocaine and ecstasy in the past.  His drug screen was positive for marijuana.  He does have charges on him, but his court date for assault on a police officer is not until December.  He stated that there was no warrant out for his arrest.  [name], and is well very paranoid.  [name], but he stated it really did not "help me sleep".  Review of his laboratories revealed normal liver function enzymes, a mildly elevated white blood cell count at 15.2.  His blood alcohol on admission was 133.  His drug screen was positive for marijuana.  He also admitted to depression and crying spells.  He denied any previous episodes of euphoria or excessive spending.  [name] that really he had only had 1 previous psychiatric admission.  [name].  He was seen by Dr. Jama Flavorsobos  on 04/06/2019.  Apparently he had been prescribed Zyprexa when he was hospitalized in [place] recently.  He was given 5 mg on 11/2.  He was also given trazodone as well as Vistaril.  He was transferred to our facility for admission on 04/07/2019.  Continued Clinical Symptoms:  Alcohol Use Disorder Identification Test Final Score (AUDIT): 16 The "Alcohol Use  Disorders Identification Test", Guidelines for Use in Primary Care, Second Edition.  World Science writer [facility]). Score between 0-7:  no or low risk or alcohol related problems. Score between 8-15:  moderate risk of alcohol related problems. Score between 16-19:  high risk of alcohol related problems. Score 20 or above:  warrants further diagnostic evaluation for alcohol dependence and treatment.   CLINICAL FACTORS:   Depression:   Anhedonia Comorbid alcohol abuse/dependence Delusional Hopelessness Impulsivity Insomnia Alcohol/Substance Abuse/Dependencies Currently Psychotic Previous Psychiatric Diagnoses and Treatments   Musculoskeletal: Strength & Muscle Tone: within normal limits Gait & Station: normal Patient leans: N/A  Psychiatric Specialty Exam: Physical Exam  Nursing note and vitals reviewed. Constitutional: [name], place, and time. He appears well-developed and well-nourished.  HENT:  Head: Normocephalic and atraumatic.  Respiratory: Effort normal.  Neurological: He is alert and oriented to person, place, and time.    ROS  Blood pressure 129/73, pulse 93, temperature 98.2 F (36.8 C), temperature source Oral, resp. rate 18, height 5\' 6"  (1.676 m), weight 63 kg, SpO2 100 %.Body mass index is 22.42 kg/m.  General Appearance: Disheveled  Eye Contact:  Fair  Speech:  Normal Rate  Volume:  Normal  Mood:  Anxious, Depressed and Dysphoric  Affect:  Constricted  Thought Process:  Disorganized and Descriptions of Associations: Circumstantial  Orientation:  Full (Time, Place, and Person)  Thought Content:  Delusions, Hallucinations: Auditory, Paranoid Ideation and Rumination  Suicidal Thoughts:  Yes.  without intent/plan  Homicidal Thoughts:  No  Memory:  Immediate;   Fair Recent;   Fair Remote;   Fair  Judgement:  Impaired  Insight:  Fair  Psychomotor Activity:  Increased  Concentration:  Concentration: Fair and Attention Span: Fair   Recall:  of Knowledge:  Fair  Language:  Fair  Akathisia:  Negative  Handed:  Right  AIMS (if indicated):     Assets:  Desire for Improvement Resilience  ADL's:  Impaired  Cognition:  WNL  Sleep:         COGNITIVE FEATURES THAT CONTRIBUTE TO RISK:  None    SUICIDE RISK:   Mild:  Suicidal ideation of limited frequency, intensity, duration, and specificity.  There are no identifiable plans, no associated intent, mild dysphoria and related symptoms, good self-control (both objective and subjective assessment), few other risk factors, and identifiable protective factors, including available and accessible social support.  PLAN OF CARE: Patient is seen and examined.  Patient is a 28 year old male with a past psychiatric history significant for psychosis, polysubstance use disorders including cocaine, ecstasy and alcohol as well as marijuana and psychotic symptoms of some unspecified amount of time.  He will be admitted to the hospital.  He will be integrated into the milieu.  He will be encouraged to attend groups.  He did not feel as though the Zyprexa helped him sleep at all, and I am going switching to Risperdal 2 mg p.o. twice daily.  We will give him the first dose of Risperdal right now.  He also had significant alcohol, so I am going to add a 1 mg Ativan  4 times daily as needed a CIWA greater than 10.  He will also have available Geodon or lorazepam as needed for agitation.  He will also have available trazodone 50 mg p.o. nightly as needed insomnia.  We will attempt tomorrow to get some old records from [place] and perhaps even mobile to try and track down the advent of his psychotic illness.  A TSH was not obtained, and I will order that today for completeness.  I certify that inpatient services furnished can reasonably be expected to improve the patient's condition.   Sharma Covert, MD 04/07/2019, 4:32 PM

## 2019-04-07 NOTE — Progress Notes (Signed)
Pt accepted to [facility]; bed 506-1    Dr. Parke Poisson is the accepting provider.    Dr. Jake Samples is the attending provider.    Call report to [phone]    [facility] ED notified.     Pt is involuntary and will be transported by law enforcement.   Pt may arrive as soon as transportation is arranged.   Audree Camel, LCSW, [place] Disposition [facility] BHH/TTS [phone]

## 2019-04-07 NOTE — ED Notes (Signed)
Pt sitting on bed watching tv and talking w/Sitter. Pt aware waiting for transport.

## 2019-04-07 NOTE — ED Notes (Signed)
Per [facility], pt has been accepted to [facility] - 506-1.

## 2019-04-07 NOTE — Tx Team (Signed)
Initial Treatment Plan 04/07/2019 12:57 PM Nathaniel Holland [id number]:[id number]    PATIENT STRESSORS: Financial difficulties Health problems Occupational concerns Substance abuse Traumatic event   PATIENT STRENGTHS: Ability for insight Active sense of humor Capable of independent living Communication skills Supportive family/friends Work skills   PATIENT IDENTIFIED PROBLEMS: Crying spells  Substance abuse  anxiety                 DISCHARGE CRITERIA:  Ability to meet basic life and health needs Improved stabilization in mood, thinking, and/or behavior Medical problems require only outpatient monitoring Motivation to continue treatment in a less acute level of care  PRELIMINARY DISCHARGE PLAN: Outpatient therapy Return to previous living arrangement  PATIENT/FAMILY INVOLVEMENT: This treatment plan has been presented to and reviewed with the patient, Nathaniel Holland.  The patient and family have been given the opportunity to ask questions and make suggestions.  Baron Sane, RN 04/07/2019, 12:57 PM

## 2019-04-07 NOTE — ED Notes (Signed)
Diet was ordered for Lunch. 

## 2019-04-07 NOTE — ED Notes (Signed)
ALL belongings - 1 labeled belongings bag - Deputy - Pt aware.  

## 2019-04-07 NOTE — ED Notes (Signed)
Pt asked if a family member would be allowed to bring him boxer briefs. Advised OK.

## 2019-04-07 NOTE — ED Notes (Addendum)
Pt attempted to run out of the ED. Was stopped by Security and [employer]. Pt escorted back to room w/o difficulty. Pt took po Ativan w/o difficulty. Pt states he ran out d/t wanted to escape to outside. States he begins hearing a voice that takes over and he tries to run "to get to a safer place". States he was not wanting to hurt anyone. Pt spoke w/his mother on phone for approx 15 minutes - assisting pt w/calming down. Pt initially stated he did not want injection of Geodon. After pt calmed, pt requested injection to be given. Advised pt this will delay transport to [facility]. Pt then stated he did not want it as he wants to go there. Pt apologized to officers, staff, and this RN. Requested to shower - states this assists w/calming him. Pt in shower.

## 2019-04-07 NOTE — ED Notes (Signed)
[facility] [employer] transporting pt to [facility] - ALL belongings - 1 labeled belongings bag - Deputy - Pt aware.
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
## 2019-04-07 NOTE — ED Notes (Signed)
Breakfast Ordered 

## 2019-04-07 NOTE — Progress Notes (Signed)
Patient rated his day as a 6 out of a possible 10. He states that he felt better as the day wore on since he met his new peers. He also states that he had a good visit with his mother. Finally, he maintained that he is anxious to see if his new med's work.

## 2019-04-07 NOTE — ED Notes (Signed)
Pt lying on bed w/blanket covering him from head-to-toe. Pt removed head to take meds as requested.

## 2019-04-07 NOTE — ED Notes (Signed)
Nathaniel Holland, [name] - re: pt.

## 2019-04-07 NOTE — H&P (Signed)
Psychiatric Admission Assessment Adult  Patient Identification: Nathaniel Holland MRN:  [id number] Date of Evaluation:  04/07/2019 Chief Complaint:  PSYCHOTIC DISORDER Principal Diagnosis: <principal problem not specified> Diagnosis:  Active Problems:   Psychotic disorder ([place])  History of Present Illness: Patient is seen and examined.  Patient is a 28 year old male with an unspecified past psychiatric history who was originally brought to the [facility] emergency department on 04/05/2019 under involuntary commitment.  The patient was reported to be suicidal, having auditory hallucinations, becoming aggressive and hostile.  There was concern for self-medication.  The patient did admit that he had been smoking marijuana.  The patient story is quite convoluted.  Patient stated that he had been using ecstasy for a while, but had not used it recently.  He stated he basically uses marijuana on a daily basis.  He previously lived in [place], [place].  He was there for approximately 8 years.  He has a significant other there who has at least 2 children by him.  Somehow or another he has 2 charges on him therefore is assault on a male which apparently was dismissed, and also a charge of assaulting a Engineer, structural.  He admitted that he felt paranoid and worried about people following him.  He stated he had seen information that made him know that people were after him.  He did admit to auditory hallucinations, but sometimes "sometimes I think it is just in my head".  After these complicated issues he stated he went to [place], and was arrested there.  He was given his an unspecified psychiatric medication there thought to be Zyprexa.  He was given a prescription, but he stated he never filled it.  He then came to [place].  He stated his mother and stepfather live here.  He came here couple months ago.  [name], and his blood alcohol on admission was 133.  He stated he uses marijuana  almost on a daily basis.  [name] he had used cocaine and ecstasy in the past.  His drug screen was positive for marijuana.  He does have charges on him, but his court date for assault on a police officer is not until December.  He stated that there was no warrant out for his arrest.  [name], and is well very paranoid.  [name], but he stated it really did not "help me sleep".  Review of his laboratories revealed normal liver function enzymes, a mildly elevated white blood cell count at 15.2.  His blood alcohol on admission was 133.  His drug screen was positive for marijuana.  He also admitted to depression and crying spells.  He denied any previous episodes of euphoria or excessive spending.  [name] that really he had only had 1 previous psychiatric admission.  He apparently is in [facility] [place].  He was seen by Dr. Parke Poisson on 04/06/2019.  Apparently he had been prescribed Zyprexa when he was hospitalized in [place] recently.  He was given 5 mg on 11/2.  He was also given trazodone as well as Vistaril.  He was transferred to our facility for admission on 04/07/2019.  Associated Signs/Symptoms: Depression Symptoms:  depressed mood, anhedonia, insomnia, psychomotor agitation, fatigue, feelings of worthlessness/guilt, difficulty concentrating, hopelessness, suicidal thoughts without plan, anxiety, loss of energy/fatigue, (Hypo) Manic Symptoms:  Impulsivity, Irritable Mood, Labiality of Mood, Anxiety Symptoms:  Excessive  Worry, Psychotic Symptoms:  Delusions, Hallucinations: Auditory Paranoia, PTSD Symptoms: Negative Total Time spent with patient: 45 minutes  Past Psychiatric History: Patient stated this was his first psychiatric admission, but I suspect he has been hospitalized before.  There is some suggestion that he was hospitalized in [place] earlier this year.  Is  the patient at risk to self? Yes.    Has the patient been a risk to self in the past 6 months? Yes.    Has the patient been a risk to self within the distant past? No.  Is the patient a risk to others? Yes.    Has the patient been a risk to others in the past 6 months? Yes.    Has the patient been a risk to others within the distant past? No.   Prior Inpatient Therapy:   Prior Outpatient Therapy:    Alcohol Screening: 1. How often do you have a drink containing alcohol?: 4 or more times a week 2. How many drinks containing alcohol do you have on a typical day when you are drinking?: 5 or 6 3. How often do you have six or more drinks on one occasion?: Monthly AUDIT-C Score: 8 4. How often during the last year have you found that you were not able to stop drinking once you had started?: Never 5. How often during the last year have you failed to do what was normally expected from you becasue of drinking?: Monthly 6. How often during the last year have you needed a first drink in the morning to get yourself going after a heavy drinking session?: Never 7. How often during the last year have you had a feeling of guilt of remorse after drinking?: Monthly 8. How often during the last year have you been unable to remember what happened the night before because you had been drinking?: Never 9. Have you or someone else been injured as a result of your drinking?: No 10. Has a relative or friend or a doctor or another health worker been concerned about your drinking or suggested you cut down?: Yes, during the last year Alcohol Use Disorder Identification Test Final Score (AUDIT): 16 Substance Abuse History in the last 12 months:  Yes.   Consequences of Substance Abuse: Withdrawal Symptoms:   Headaches Nausea Tremors Previous Psychotropic Medications: No  Psychological Evaluations: Yes  Past Medical History:  Past Medical History:  Diagnosis Date  . Bipolar 1 disorder (HCC)    History reviewed. No  pertinent surgical history. Family History: History reviewed. No pertinent family history. Family Psychiatric  History: He stated that his sister was "a little off and he had one other family member that had unspecified psychiatric illness. Tobacco Screening:   Social History:  Social History   Substance and Sexual Activity  Alcohol Use Yes   Comment: "depends on how I feel and how much cannabis I have"     Social History   Substance and Sexual Activity  Drug Use Yes  . Types: Marijuana    Additional Social History:                           Allergies:  No Known Allergies Lab Results: No results found for this or any previous visit (from the past 48 hour(s)).  Blood Alcohol level:  Lab Results  Component Value Date   ETH 133 (H) 04/04/2019   ETH <10 01/20/2019    Metabolic Disorder Labs:  No results found  for: HGBA1C, MPG No results found for: PROLACTIN No results found for: CHOL, TRIG, HDL, CHOLHDL, VLDL, LDLCALC  Current Medications: Current Facility-Administered Medications  Medication Dose Route Frequency Provider Last Rate Last Dose  . folic acid (FOLVITE) tablet 1 mg  1 mg Oral Daily Antonieta Pert, MD      . hydrOXYzine (ATARAX/VISTARIL) tablet 25 mg  25 mg Oral TID PRN Denzil Magnuson, NP      . LORazepam (ATIVAN) tablet 1 mg  1 mg Oral Q6H PRN Antonieta Pert, MD      . OLANZapine zydis (ZYPREXA) disintegrating tablet 10 mg  10 mg Oral Q8H PRN Antonieta Pert, MD       And  . LORazepam (ATIVAN) tablet 1 mg  1 mg Oral PRN Antonieta Pert, MD       And  . ziprasidone (GEODON) injection 20 mg  20 mg Intramuscular PRN Antonieta Pert, MD      . omega-3 acid ethyl esters (LOVAZA) capsule 1 g  1 g Oral BID Antonieta Pert, MD      . risperiDONE (RISPERDAL) tablet 2 mg  2 mg Oral BID Antonieta Pert, MD      . thiamine (VITAMIN B-1) tablet 100 mg  100 mg Oral Daily Antonieta Pert, MD      . traZODone (DESYREL) tablet 50 mg  50  mg Oral QHS PRN Denzil Magnuson, NP       PTA Medications: No medications prior to admission.    Musculoskeletal: Strength & Muscle Tone: within normal limits Gait & Station: normal Patient leans: N/A  Psychiatric Specialty Exam: Physical Exam  Nursing note and vitals reviewed. Constitutional: [name], place, and time. He appears well-developed and well-nourished.  HENT:  Head: Normocephalic and atraumatic.  Respiratory: Effort normal.  Neurological: He is alert and oriented to person, place, and time.    ROS  Blood pressure 129/73, pulse 93, temperature 98.2 F (36.8 C), temperature source Oral, resp. rate 18, height 5\' 6"  (1.676 m), weight 63 kg, SpO2 100 %.Body mass index is 22.42 kg/m.  General Appearance: Disheveled  Eye Contact:  Fair  Speech:  Normal Rate  Volume:  Normal  Mood:  Anxious, Depressed and Dysphoric  Affect:  Congruent  Thought Process:  Disorganized and Descriptions of Associations: Circumstantial  Orientation:  Full (Time, Place, and Person)  Thought Content:  Delusions, Hallucinations: Auditory, Paranoid Ideation and Rumination  Suicidal Thoughts:  Yes.  without intent/plan  Homicidal Thoughts:  No  Memory:  Immediate;   Fair Recent;   Fair Remote;   Fair  Judgement:  Impaired  Insight:  Fair  Psychomotor Activity:  Increased  Concentration:  Concentration: Fair and Attention Span: Fair  Recall:  of Knowledge:  Fair  Language:  Fair  Akathisia:  Negative  Handed:  Right  AIMS (if indicated):     Assets:  Desire for Improvement Resilience  ADL's:  Intact  Cognition:  WNL  Sleep:       Treatment Plan Summary: Daily contact with patient to assess and evaluate symptoms and progress in treatment, Medication management and Plan : Patient is seen and examined.  Patient is a 28 year old male with a past psychiatric history significant for psychosis, polysubstance use disorders including cocaine, ecstasy and alcohol as  well as marijuana and psychotic symptoms of some unspecified amount of time.  He will be admitted to the hospital.  He will be integrated into the  milieu.  He will be encouraged to attend groups.  He did not feel as though the Zyprexa helped him sleep at all, and I am going switching to Risperdal 2 mg p.o. twice daily.  We will give him the first dose of Risperdal right now.  He also had significant alcohol, so I am going to add a 1 mg Ativan 4 times daily as needed a CIWA greater than 10.  He will also have available Geodon or lorazepam as needed for agitation.  He will also have available trazodone 50 mg p.o. nightly as needed insomnia.  We will attempt tomorrow to get some old records from [place] and perhaps even mobile to try and track down the advent of his psychotic illness.  A TSH was not obtained, and I will order that today for completeness.  Observation Level/Precautions:  15 minute checks  Laboratory:  Chemistry Profile  Psychotherapy:    Medications:    Consultations:    Discharge Concerns:    Estimated LOS:  Other:     Physician Treatment Plan for Primary Diagnosis: <principal problem not specified> Long Term Goal(s): Improvement in symptoms so as ready for discharge  Short Term Goals: Ability to identify changes in lifestyle to reduce recurrence of condition will improve, Ability to verbalize feelings will improve, Ability to disclose and discuss suicidal ideas, Ability to demonstrate self-control will improve, Ability to identify and develop effective coping behaviors will improve, Ability to maintain clinical measurements within normal limits will improve, Compliance with prescribed medications will improve and Ability to identify triggers associated with substance abuse/mental health issues will improve  Physician Treatment Plan for Secondary Diagnosis: Active Problems:   Psychotic disorder (HCC)  Long Term Goal(s): Improvement in symptoms so as ready for discharge  Short Term  Goals: Ability to identify changes in lifestyle to reduce recurrence of condition will improve, Ability to verbalize feelings will improve, Ability to disclose and discuss suicidal ideas, Ability to demonstrate self-control will improve, Ability to identify and develop effective coping behaviors will improve, Ability to maintain clinical measurements within normal limits will improve, Compliance with prescribed medications will improve and Ability to identify triggers associated with substance abuse/mental health issues will improve  I certify that inpatient services furnished can reasonably be expected to improve the patient's condition.    Antonieta Pert, MD 11/3/20204:45 PM

## 2019-04-08 DIAGNOSIS — F250 Schizoaffective disorder, bipolar type: Secondary | ICD-10-CM

## 2019-04-08 LAB — LIPID PANEL
Cholesterol: 167 mg/dL (ref 0–200)
HDL: 54 mg/dL (ref >40)
LDL Cholesterol: 86 mg/dL (ref 0–99)
Total CHOL/HDL Ratio: 3.1 RATIO
Triglycerides: 135 mg/dL (ref <150)
VLDL: 27 mg/dL (ref 0–40)

## 2019-04-08 LAB — HEMOGLOBIN A1C
Hgb A1c MFr Bld: 5.3 % (ref 4.8–5.6)
Mean Plasma Glucose: 105.41 mg/dL

## 2019-04-08 LAB — TSH: TSH: 1.672 u[IU]/mL (ref 0.350–4.500)

## 2019-04-08 MED ORDER — BENZTROPINE MESYLATE 0.5 MG PO TABS
0.5000 mg | ORAL_TABLET | Freq: Two times a day (BID) | ORAL | Status: DC
  Administered 2019-04-08 – 2019-04-10 (×4): 0.5 mg via ORAL
  Filled 2019-04-08 (×7): qty 1

## 2019-04-08 MED ORDER — RISPERIDONE 3 MG PO TABS
3.0000 mg | ORAL_TABLET | Freq: Two times a day (BID) | ORAL | Status: DC
  Administered 2019-04-08 – 2019-04-10 (×4): 3 mg via ORAL
  Filled 2019-04-08 (×6): qty 1

## 2019-04-08 MED ORDER — CLONIDINE HCL 0.1 MG PO TABS
0.1000 mg | ORAL_TABLET | Freq: Two times a day (BID) | ORAL | Status: DC
  Administered 2019-04-08 – 2019-04-10 (×4): 0.1 mg via ORAL
  Filled 2019-04-08 (×7): qty 1

## 2019-04-08 NOTE — [facility] Group Notes (Signed)
Adult Psychoeducational Group Note  Date:  04/08/2019 Time:  10:45 PM  Group Topic/Focus:  Wrap-Up Group:   The focus of this group is to help patients review their daily goal of treatment and discuss progress on daily workbooks.  Participation Level:  Active  Participation Quality:  Appropriate and Attentive  Affect:  Appropriate  Cognitive:  Alert and Appropriate  Insight: Appropriate and Good  Engagement in Group:  Engaged  Modes of Intervention:  Discussion and Education  Additional Comments:  Pt attended and participated in wrap up group this evening and rated their day a 7/10. Pt competed their goal, which was to manage their anxiety and emotions. Pt completed their goal by letting staff know what they needed.   Cristi Loron 04/08/2019, 10:45 PM

## 2019-04-08 NOTE — Tx Team (Signed)
Interdisciplinary Treatment and Diagnostic Plan Update  04/08/2019 Time of Session: 10:20am Estil Vallee MRN: [id number]  Principal Diagnosis: <principal problem not specified>  Secondary Diagnoses: Active Problems:   Psychotic disorder (HCC)   Current Medications:  Current Facility-Administered Medications  Medication Dose Route Frequency Provider Last Rate Last Dose  . benztropine (COGENTIN) tablet 0.5 mg  0.5 mg Oral BID Malvin Johns, MD      . cloNIDine (CATAPRES) tablet 0.1 mg  0.1 mg Oral BID Malvin Johns, MD      . folic acid (FOLVITE) tablet 1 mg  1 mg Oral Daily Antonieta Pert, MD   1 mg at 04/08/19 0809  . hydrOXYzine (ATARAX/VISTARIL) tablet 25 mg  25 mg Oral TID PRN Denzil Magnuson, NP   25 mg at 04/07/19 2100  . LORazepam (ATIVAN) tablet 1 mg  1 mg Oral Q6H PRN Antonieta Pert, MD      . OLANZapine zydis [facility]) disintegrating tablet 10 mg  10 mg Oral Q8H PRN Antonieta Pert, MD   10 mg at 04/08/19 9381   And  . LORazepam (ATIVAN) tablet 1 mg  1 mg Oral PRN Antonieta Pert, MD       And  . ziprasidone (GEODON) injection 20 mg  20 mg Intramuscular PRN Antonieta Pert, MD      . omega-3 acid ethyl esters (LOVAZA) capsule 1 g  1 g Oral BID Antonieta Pert, MD   1 g at 04/08/19 0809  . risperiDONE (RISPERDAL) tablet 3 mg  3 mg Oral BID Malvin Johns, MD      . thiamine (VITAMIN B-1) tablet 100 mg  100 mg Oral Daily Antonieta Pert, MD   100 mg at 04/08/19 0809  . traZODone (DESYREL) tablet 100 mg  100 mg Oral QHS PRN Jearld Lesch, NP   100 mg at 04/07/19 2100   PTA Medications: No medications prior to admission.    Patient Stressors: Financial difficulties Health problems Occupational concerns Substance abuse Traumatic event  Patient Strengths: Ability for insight Active sense of humor Capable of independent living Communication skills Supportive family/friends Work skills  Treatment Modalities: Medication Management, Group therapy,  Case management,  1 to 1 session with clinician, Psychoeducation, Recreational therapy.   Physician Treatment Plan for Primary Diagnosis: <principal problem not specified> Long Term Goal(s): Improvement in symptoms so as ready for discharge Improvement in symptoms so as ready for discharge   Short Term Goals: Ability to identify changes in lifestyle to reduce recurrence of condition will improve Ability to verbalize feelings will improve Ability to disclose and discuss suicidal ideas Ability to demonstrate self-control will improve Ability to identify and develop effective coping behaviors will improve Ability to maintain clinical measurements within normal limits will improve Compliance with prescribed medications will improve Ability to identify triggers associated with substance abuse/mental health issues will improve Ability to identify changes in lifestyle to reduce recurrence of condition will improve Ability to verbalize feelings will improve Ability to disclose and discuss suicidal ideas Ability to demonstrate self-control will improve Ability to identify and develop effective coping behaviors will improve Ability to maintain clinical measurements within normal limits will improve Compliance with prescribed medications will improve Ability to identify triggers associated with substance abuse/mental health issues will improve  Medication Management: Evaluate patient's response, side effects, and tolerance of medication regimen.  Therapeutic Interventions: 1 to 1 sessions, Unit Group sessions and Medication administration.  Evaluation of Outcomes: Progressing  Physician Treatment Plan for Secondary Diagnosis:  Active Problems:   Psychotic disorder ([place])  Long Term Goal(s): Improvement in symptoms so as ready for discharge Improvement in symptoms so as ready for discharge   Short Term Goals: Ability to identify changes in lifestyle to reduce recurrence of condition will  improve Ability to verbalize feelings will improve Ability to disclose and discuss suicidal ideas Ability to demonstrate self-control will improve Ability to identify and develop effective coping behaviors will improve Ability to maintain clinical measurements within normal limits will improve Compliance with prescribed medications will improve Ability to identify triggers associated with substance abuse/mental health issues will improve Ability to identify changes in lifestyle to reduce recurrence of condition will improve Ability to verbalize feelings will improve Ability to disclose and discuss suicidal ideas Ability to demonstrate self-control will improve Ability to identify and develop effective coping behaviors will improve Ability to maintain clinical measurements within normal limits will improve Compliance with prescribed medications will improve Ability to identify triggers associated with substance abuse/mental health issues will improve     Medication Management: Evaluate patient's response, side effects, and tolerance of medication regimen.  Therapeutic Interventions: 1 to 1 sessions, Unit Group sessions and Medication administration.  Evaluation of Outcomes: Progressing   RN Treatment Plan for Primary Diagnosis: <principal problem not specified> Long Term Goal(s): Knowledge of disease and therapeutic regimen to maintain health will improve  Short Term Goals: Ability to participate in decision making will improve, Ability to verbalize feelings will improve, Ability to disclose and discuss suicidal ideas, Ability to identify and develop effective coping behaviors will improve and Compliance with prescribed medications will improve  Medication Management: RN will administer medications as ordered by provider, will assess and evaluate patient's response and provide education to patient for prescribed medication. RN will report any adverse and/or side effects to prescribing  provider.  Therapeutic Interventions: 1 on 1 counseling sessions, Psychoeducation, Medication administration, Evaluate responses to treatment, Monitor vital signs and CBGs as ordered, Perform/monitor CIWA, COWS, AIMS and Fall Risk screenings as ordered, Perform wound care treatments as ordered.  Evaluation of Outcomes: Progressing   LCSW Treatment Plan for Primary Diagnosis: <principal problem not specified> Long Term Goal(s): Safe transition to appropriate next level of care at discharge, Engage patient in therapeutic group addressing interpersonal concerns.  Short Term Goals: Engage patient in aftercare planning with referrals and resources and Increase skills for wellness and recovery  Therapeutic Interventions: Assess for all discharge needs, 1 to 1 time with Social worker, Explore available resources and support systems, Assess for adequacy in community support network, Educate family and significant other(s) on suicide prevention, Complete Psychosocial Assessment, Interpersonal group therapy.  Evaluation of Outcomes: Progressing   Progress in Treatment: Attending groups: No. Participating in groups: No. Taking medication as prescribed: Yes. Toleration medication: Yes. Family/Significant other contact made: No, will contact:  pt's mother Patient understands diagnosis: No. Discussing patient identified problems/goals with staff: Yes. Medical problems stabilized or resolved: Yes. Denies suicidal/homicidal ideation: Yes. Issues/concerns per patient self-inventory: No. Other:   New problem(s) identified: No, Describe:  None  New Short Term/Long Term Goal(s): Medication stabilization, elimination of SI thoughts, and development of a comprehensive mental wellness plan.   Patient Goals:  "Control emotions and make better actions"  Discharge Plan or Barriers: CSW will continue to follow up for appropriate referrals and possible discharge planning  Reason for Continuation of  Hospitalization: Aggression Hallucinations Medication stabilization  Estimated Length of Stay: 2-3 days   Attendees: Patient: Mosi Hannold 04/08/2019   Physician: Dr. Aaron Edelman  Jeannine KittenFarah, MD 04/08/2019   Nursing: Marton Redwoodoni, RN 04/08/2019   RN Care Manager: 04/08/2019   Social Worker: Stephannie PetersJasmine Claudean Leavelle, LCSW  04/08/2019   Recreational Therapist:  04/08/2019  Other: Earlyne IbaSophia Nagy, MSW Intern 04/08/2019   Other:  04/08/2019  Other: 04/08/2019    Scribe for Treatment Team: Delphia GratesJasmine M Jozeph Persing, LCSW 04/08/2019 11:24 AM

## 2019-04-08 NOTE — Progress Notes (Signed)
   04/08/19 0000  Psych Admission Type (Psych Patients Only)  Admission Status Involuntary  Psychosocial Assessment  Patient Complaints Anxiety;Hyperactivity;Restlessness  Eye Contact Fair  Facial Expression Animated;Anxious  Affect Anxious;Depressed;Preoccupied  Speech International aid/development worker;Restless;Slow  Appearance/Hygiene Unremarkable  Behavior Characteristics Cooperative  Mood Depressed;Anxious  Thought Process  Coherency WDL  Content Blaming others  Delusions None reported or observed  Perception Hallucinations  Hallucination None reported or observed;Auditory  Judgment Impaired  Confusion Mild  Danger to Self  Current suicidal ideation? Passive  Self-Injurious Behavior Some self-injurious ideation observed or expressed.  No lethal plan expressed   Agreement Not to Harm Self Yes  Description of Agreement verbal contract for saffety  Danger to Others  Danger to Others None reported or observed   Pt stated he had been having AH since being a child , but didn't tell anyone. Pt educated on medication compliance to help with the voices . Pt visible on the unit this evening.

## 2019-04-08 NOTE — Progress Notes (Signed)
Recreation Therapy Notes  Date: 11.4.20 Time: 1000 Location: [address] Dayroom   Group Topic: Communication, Team Building, Problem Solving  Goal Area(s) Addresses:  Patient will effectively work with peer towards shared goal.  Patient will identify skill used to make activity successful.  Patient will identify how skills used during activity can be used to reach post d/c goals.   Behavioral Response: Engaged  Intervention: STEM Activity   Activity: [employer]. Patients were provided the following materials: 5 drinking straws, 5 rubber bands, 5 paper clips, 2 index cards and 2 drinking cups. Using the provided materials patients were asked to build a launching mechanisms to launch a ping pong ball approximately 12 feet. Patients were divided into teams of 3-5.   Education: Education officer, community, Dentist.   Education Outcome: Acknowledges education/In group clarification offered/Needs additional education.   Clinical Observations/Feedback:  Pt arrived late to group but worked on a Manufacturing engineer.  Pt appeared determined and focused to complete activity.  Pt worked on his own because his partner just observed and eventually left.  Pt had a few setbacks but was able to create a successful launcher.    Victorino Sparrow, LRT/CTRS    Victorino Sparrow A 04/08/2019 12:41 PM

## 2019-04-08 NOTE — [facility] Suicide Risk Assessment (Cosign Needed)
[place] INPATIENT:  Family/Significant Other Suicide Prevention Education  Suicide Prevention Education:  Education Completed; with mother, Kelwin Gibler, [phone] has been identified by the patient as the family member/significant other with whom the patient will be residing, and identified as the person(s) who will aid the patient in the event of a mental health crisis (suicidal ideations/suicide attempt).  With written consent from the patient, the family member/significant other has been provided the following suicide prevention education, prior to the and/or following the discharge of the patient.  The suicide prevention education provided includes the following:  Suicide risk factors  Suicide prevention and interventions  National Suicide Hotline telephone number  [facility] assessment telephone number  [facility] Emergency Assistance [place] and/or Residential Mobile Crisis Unit telephone number  Request made of family/significant other to:  Remove weapons (e.g., guns, rifles, knives), all items previously/currently identified as safety concern.    Remove drugs/medications (over-the-counter, prescriptions, illicit drugs), all items previously/currently identified as a safety concern.  The family member/significant other verbalizes understanding of the suicide prevention education information provided.  The family member/significant other agrees to remove the items of safety concern listed above.  Pt's mother states how she wants him to get better but is not sure what is going on. Pt's mother did not understand the diagnoses and asked "what makes someone like this?". Pt's mother was given the phone number to communicate with Dr. Jake Samples. Pt's mother mentions there is no guns in the home.   Billey Chang 04/08/2019, 11:50 AM

## 2019-04-08 NOTE — Progress Notes (Signed)
[facility]  04/08/2019 8:25 AM Nathaniel Holland  MRN:  [id number] Subjective:    Mr. Nathaniel Holland is 28 years of age she has a history of criminal behaviors, volatility and violence, chronic cannabis dependency, abuse of ecstasy as well.  He presented in August seeking mental health evaluation was cleared through the emergency department, and dangerousness was released with outpatient follow-up he represented on 10/31 and the sheriff was called to his residence due to aggressiveness patient was intoxicated, blood alcohol level of 133 and drug screen showing cannabis.  He even had a syncopal type episode while law enforcement was trying to corral him. When asked him to tell me briefly his case he states "I want to talk about it all over again" he is guarded little bit paranoid but overall attempts to be cooperative after some encouragement.  He denies auditory or visual hallucinations he denies the previous expressed paranoia.  He denies wanting to harm self or others, but is very vague on exam states he is not sure why he is here he just "ran from the police because I do not like them" and cannot give me more specifics.  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 Bipolar 1 disorder (HCC)    History reviewed. No pertinent surgical history. Family History: History reviewed. No pertinent family history. Family Psychiatric  History: see eval Social History:  Social History   Substance and Sexual Activity  Alcohol Use Yes    Comment: "depends on how I feel and how much cannabis I have"     Social History   Substance and Sexual Activity  Drug Use Yes  . Types: Marijuana    Social History   Socioeconomic History  . Marital status: Single    Spouse name: Not on file  . Number of children: Not on file  . Years of education: Not on file  . Highest education level: Not on file  Occupational History  . Not on file  Social Needs  . Financial resource strain: Not on file  . Food insecurity    Worry: Not on file    Inability: Not on file  . Transportation needs    Medical: Not on file    Non-medical: Not on file  Tobacco Use  . Smoking status: Never Smoker  . Smokeless tobacco: Never Used  Substance and Sexual Activity  . Alcohol use: Yes    Comment: "depends on how I feel and how much cannabis I have"  . Drug use: Yes    Types: Marijuana  . Sexual activity: Yes  Lifestyle  . Physical activity    Days per week: Not on file    Minutes per session: Not on file  . Stress: Not on file  Relationships  . Social Musicianconnections    Talks on phone: Not on file    Gets together: Not on file    Attends religious service: Not on file    Active member of club or organization: Not on file    Attends meetings of clubs or organizations: Not on file  Relationship status: Not on file  Other Topics Concern  . Not on file  Social History Narrative  . Not on file   Additional Social History:                         Sleep: Fair  Appetite:  Fair  Current Medications: Current Facility-Administered Medications  Medication Dose Route Frequency Provider Last Rate Last Dose  . benztropine (COGENTIN) tablet 0.5 mg  0.5 mg Oral BID Johnn Hai, MD      . cloNIDine (CATAPRES) tablet 0.1 mg  0.1 mg Oral BID Johnn Hai, MD      . folic acid (FOLVITE) tablet 1 mg  1 mg Oral Daily Sharma Covert, MD   1 mg at 04/08/19 0809  . hydrOXYzine (ATARAX/VISTARIL) tablet 25 mg  25 mg Oral TID PRN Mordecai Maes, NP   25 mg at 04/07/19 2100  . LORazepam (ATIVAN) tablet 1 mg  1 mg Oral Q6H PRN Sharma Covert, MD      . OLANZapine zydis [facility]) disintegrating tablet 10 mg  10 mg Oral Q8H PRN Sharma Covert, MD   10 mg at 04/08/19 8841   And  . LORazepam (ATIVAN) tablet 1 mg  1 mg Oral PRN Sharma Covert, MD       And  . ziprasidone (GEODON) injection 20 mg  20 mg Intramuscular PRN Sharma Covert, MD      . omega-3 acid ethyl esters (LOVAZA) capsule 1 g  1 g Oral BID Sharma Covert, MD   1 g at 04/08/19 0809  . risperiDONE (RISPERDAL) tablet 3 mg  3 mg Oral BID Johnn Hai, MD      . thiamine (VITAMIN B-1) tablet 100 mg  100 mg Oral Daily Sharma Covert, MD   100 mg at 04/08/19 0809  . traZODone (DESYREL) tablet 100 mg  100 mg Oral QHS PRN Deloria Lair, NP   100 mg at 04/07/19 2100    Lab Results: No results found for this or any previous visit (from the past 48 hour(s)).  Blood Alcohol level:  Lab Results  Component Value Date   ETH 133 (H) 04/04/2019   ETH <10 66/11/3014    Metabolic Disorder Labs: No results found for: HGBA1C, MPG No results found for: PROLACTIN No results found for: CHOL, TRIG, HDL, CHOLHDL, VLDL, LDLCALC  Physical Findings: AIMS: Facial and Oral Movements Muscles of Facial Expression: None, normal Lips and Perioral Area: None, normal Jaw: None, normal Tongue: None, normal,Extremity Movements Upper (arms, wrists, hands, fingers): None, normal Lower (legs, knees, ankles, toes): None, normal, Trunk Movements Neck, shoulders, hips: None, normal, Overall Severity Severity of abnormal movements (highest score from questions above): None, normal Incapacitation due to abnormal movements: None, normal Patient's awareness of abnormal movements (rate only patient's report): No Awareness, Dental Status Current problems with teeth and/or dentures?: No Does patient usually wear dentures?: No  CIWA:  CIWA-Ar Total: 1 COWS:      Musculoskeletal: Strength & Muscle Tone: within normal limits Gait & Station: normal Patient leans: N/A  Psychiatric Specialty Exam: Physical Exam  ROS  Blood pressure (!) 116/102, pulse 97, temperature 98.4 F (36.9 C), temperature source Oral, resp. rate 18, height 5\' 6"  (1.676 m), weight 63 kg, SpO2 100 %.Body mass index is 22.42 kg/m.  General Appearance: Casual  Eye Contact:  Fair  Speech:  Pressured  Volume:  Decreased  Mood:  Euthymic/noted to be  singing to himself when not observed  Affect:  Non-Congruent  Thought Process:  Linear and Descriptions of Associations: Circumstantial  Orientation:  Full (Time, Place, and Person)  Thought Content:  Poverty of content versus guarded and minimally talkative  Suicidal Thoughts:  No  Homicidal Thoughts:  No  Memory:  Immediate;   Fair Recent;   Poor Remote;   Fair  Judgement:  Fair  Insight:  Shallow  Psychomotor Activity:  Normal  Concentration:  Concentration: Fair and Attention Span: Fair  Recall:  [employer] of Knowledge:  Fair  Language:  Fair  Akathisia:  Negative  Handed:  Right  AIMS (if indicated):     Assets:  Resilience Social Support  ADL's:  Intact  Cognition:  WNL  Sleep:  Number of Hours: 7.5     Treatment Plan Summary: Daily contact with patient to assess and evaluate symptoms and progress in treatment and Medication management  Continue antipsychotic/mood stabilizer therapy, continue to monitor for safety probable that he will get long-acting injectable discuss and try and get collateral history with family  Barri Neidlinger, MD 04/08/2019, 8:25 AM

## 2019-04-08 NOTE — Progress Notes (Signed)
Recreation Therapy Notes  INPATIENT RECREATION THERAPY ASSESSMENT  Patient Details Name: Nathaniel Holland MRN: [id number] DOB: 1990-11-09 Today's Date: 04/08/2019       Information Obtained From: Patient  Able to Participate in Assessment/Interview: Yes  Patient Presentation: Alert  Reason for Admission (Per Patient): Other (Comments)(Pt stated he was having crying spells but ended up being IVCd.)  Patient Stressors: (None identified)  Coping Skills:   Isolation, Journal, Sports, TV, Music, Exercise, Meditate, Deep Breathing, Substance Abuse, Talk, Art, Prayer, Avoidance, Hot Bath/Shower  Leisure Interests (2+):  Social - Family, Nature - Fishing, Individual - Other (Comment)(Eating)  Frequency of Recreation/Participation: Other (Comment)(Chill with mom, Eating- Daily; Fishing- Weekly)  Awareness of Community Resources:  Yes  Community Resources:  Other (Comment)([place])  Current Use: Yes  If no, Barriers?:    Expressed Interest in [employer] Information: No  County of Residence:  [place]  Patient Main Form of Transportation: Car  Patient Strengths:  Determined; Self reliant  Patient Identified Areas of Improvement:  Consistent; Try not to spas out  Patient Goal for Hospitalization:  "learn how to be a better me"  Current SI (including self-harm):  No  Current HI:  No  Current AVH: No  Staff Intervention Plan: Group Attendance, Collaborate with Interdisciplinary Treatment Team  Consent to Intern Participation: N/A    Victorino Sparrow, LRT/CTRS  [name], Jospeh Mangel A 04/08/2019, 1:27 PM
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
## 2019-04-08 NOTE — [facility] Group Notes (Signed)
[place] LCSW Group Therapy Note  Date/Time: 1:30pm 04/08/2019  Type of Therapy/Topic:  Group Therapy:  Feelings about Diagnosis  Participation Level:  Did Not Attend   Mood: Pt did not attend    Description of Group:    This group will allow patients to explore their thoughts and feelings about diagnoses they have received. Patients will be guided to explore their level of understanding and acceptance of these diagnoses. Facilitator will encourage patients to process their thoughts and feelings about the reactions of others to their diagnosis, and will guide patients in identifying ways to discuss their diagnosis with significant others in their lives. This group will be process-oriented, with patients participating in exploration of their own experiences as well as giving and receiving support and challenge from other group members.   Therapeutic Goals: 1. Patient will demonstrate understanding of diagnosis as evidence by identifying two or more symptoms of the disorder:  2. Patient will be able to express two feelings regarding the diagnosis 3. Patient will demonstrate ability to communicate their needs through discussion and/or role plays  Summary of Patient Progress:  Pt did not attend group       Therapeutic Modalities:   Cognitive Behavioral Therapy Brief Therapy Feelings Identification   Ovidio Kin, MSW intern

## 2019-04-09 LAB — PROLACTIN: Prolactin: 16.9 ng/mL — ABNORMAL HIGH (ref 4.0–15.2)

## 2019-04-09 NOTE — Progress Notes (Signed)
[facility] MD Progress Note  04/09/2019 11:31 AM Nathaniel Holland  MRN:  [id number] Subjective:   Patient seen twice today this morning he was again much improved he denied all positive symptoms but did not feel he was ready for discharge later in the morning believes he may be ready for discharge him more animated.  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 Bipolar 1 disorder (HCC)    History reviewed. No pertinent surgical history. Family History: History reviewed. No pertinent family history. Family Psychiatric  History: See eval Social History:  Social History   Substance and Sexual Activity  Alcohol Use Yes   Comment: "depends on how I feel and how much cannabis I have"     Social History   Substance and Sexual Activity  Drug Use Yes  . Types: Marijuana    Social History   Socioeconomic History  . Marital status: Single    Spouse name: Not on file  . Number of children: Not on file  . Years of education: Not on file  . Highest education level: Not on file  Occupational History  . Not on file  Social Needs  . Financial resource strain: Not on file  . Food insecurity    Worry: Not on file    Inability: Not on file  . Transportation needs    Medical: Not on file    Non-medical: Not on file  Tobacco Use  . Smoking status: Never Smoker  . Smokeless tobacco: Never Used  Substance and Sexual Activity  . Alcohol use: Yes    Comment: "depends on how I feel and how much cannabis I have"  . Drug use: Yes    Types: Marijuana  . Sexual activity: Yes  Lifestyle  . Physical activity    Days per week: Not on file    Minutes per session: Not on file  .  Stress: Not on file  Relationships  . Social Musicianconnections    Talks on phone: Not on file    Gets together: Not on file    Attends religious service: Not on file    Active member of club or organization: Not on file    Attends meetings of clubs or organizations: Not on file    Relationship status: Not on file  Other Topics Concern  . Not on file  Social History Narrative  . Not on file   Additional Social History:                         Sleep: Good  Appetite:  Good  Current Medications: Current Facility-Administered Medications  Medication Dose Route Frequency Provider Last Rate Last Dose  . benztropine (COGENTIN) tablet 0.5 mg  0.5 mg Oral BID Malvin JohnsFarah, Waymon Laser, MD   0.5 mg at 04/09/19 0801  . cloNIDine (CATAPRES) tablet 0.1 mg  0.1 mg Oral BID Malvin JohnsFarah, Cathy Crounse, MD   0.1 mg at 04/09/19 0801  . folic acid (FOLVITE) tablet 1 mg  1 mg Oral Daily Antonieta Pertlary, Greg Lawson, MD   1 mg at 04/09/19 0801  . hydrOXYzine (ATARAX/VISTARIL) tablet 25 mg  25 mg Oral TID PRN Denzil Magnusonhomas, Lashunda, NP   25  mg at 04/09/19 0803  . LORazepam (ATIVAN) tablet 1 mg  1 mg Oral Q6H PRN Sharma Covert, MD      . OLANZapine zydis [facility]) disintegrating tablet 10 mg  10 mg Oral Q8H PRN Sharma Covert, MD   10 mg at 04/08/19 0272   And  . LORazepam (ATIVAN) tablet 1 mg  1 mg Oral PRN Sharma Covert, MD       And  . ziprasidone (GEODON) injection 20 mg  20 mg Intramuscular PRN Sharma Covert, MD      . omega-3 acid ethyl esters (LOVAZA) capsule 1 g  1 g Oral BID Sharma Covert, MD   1 g at 04/09/19 0800  . risperiDONE (RISPERDAL) tablet 3 mg  3 mg Oral BID Johnn Hai, MD   3 mg at 04/09/19 0800  . thiamine (VITAMIN B-1) tablet 100 mg  100 mg Oral Daily Sharma Covert, MD   100 mg at 04/09/19 0801  . traZODone (DESYREL) tablet 100 mg  100 mg Oral QHS PRN Deloria Lair, NP   100 mg at 04/08/19 2026    Lab Results:  Results for orders placed or performed during the hospital encounter of  04/07/19 (from the past 48 hour(s))  Hemoglobin A1c     Status: None   Collection Time: 04/08/19  6:34 PM  Result Value Ref Range   Hgb A1c MFr Bld 5.3 4.8 - 5.6 %    Comment: (NOTE) Pre diabetes:          5.7%-6.4% Diabetes:              >6.4% Glycemic control for   <7.0% adults with diabetes    Mean Plasma Glucose 105.41 mg/dL    Comment: Performed at [facility], [place] [address] [place], [place] [address]  Lipid panel     Status: None   Collection Time: 04/08/19  6:34 PM  Result Value Ref Range   Cholesterol 167 0 - 200 mg/dL   Triglycerides 135 <150 mg/dL   HDL 54 >40 mg/dL   Total CHOL/HDL Ratio 3.1 RATIO   VLDL 27 0 - 40 mg/dL   LDL Cholesterol 86 0 - 99 mg/dL    Comment:        Total Cholesterol/HDL:CHD Risk Coronary Heart Disease Risk Table                     Men   Women  1/2 Average Risk   3.4   3.3  Average Risk       5.0   4.4  2 X Average Risk   9.6   7.1  3 X Average Risk  23.4   11.0        Use the calculated Patient Ratio above and the CHD Risk Table to determine the patient's CHD Risk.        ATP III CLASSIFICATION (LDL):  <100     mg/dL   Optimal  100-129  mg/dL   Near or Above                    Optimal  130-159  mg/dL   Borderline  160-189  mg/dL   High  >190     mg/dL   Very High Performed at [facility] [address], [place], [place] [address]   TSH     Status: None   Collection Time: 04/08/19  6:34 PM  Result  Value Ref Range   TSH 1.672 0.350 - 4.500 uIU/mL    Comment: Performed by a 3rd Generation assay with a functional sensitivity of <=0.01 uIU/mL. Performed at [facility], [address], [place], [place] [address]   Prolactin     Status: Abnormal   Collection Time: 04/08/19  6:34 PM  Result Value Ref Range   Prolactin 16.9 (H) 4.0 - 15.2 ng/mL    Comment: (NOTE) Performed At: [facility] [address] [place], [place] [id number] Jolene Schimke MD [id number]      Blood Alcohol level:  Lab Results  Component Value Date   ETH 133 (H) 04/04/2019   ETH <10 01/20/2019    Metabolic Disorder Labs: Lab Results  Component Value Date   HGBA1C 5.3 04/08/2019   MPG 105.41 04/08/2019   Lab Results  Component Value Date   PROLACTIN 16.9 (H) 04/08/2019   Lab Results  Component Value Date   CHOL 167 04/08/2019   TRIG 135 04/08/2019   HDL 54 04/08/2019   CHOLHDL 3.1 04/08/2019   VLDL 27 04/08/2019   LDLCALC 86 04/08/2019    Physical Findings: AIMS: Facial and Oral Movements Muscles of Facial Expression: (P) None, normal Lips and Perioral Area: (P) None, normal Jaw: (P) None, normal Tongue: (P) None, normal,Extremity Movements Upper (arms, wrists, hands, fingers): (P) None, normal Lower (legs, knees, ankles, toes): (P) None, normal, Trunk Movements Neck, shoulders, hips: (P) None, normal, Overall Severity Severity of abnormal movements (highest score from questions above): (P) None, normal Incapacitation due to abnormal movements: (P) None, normal Patient's awareness of abnormal movements (rate only patient's report): (P) No Awareness, Dental Status Current problems with teeth and/or dentures?: (P) No Does patient usually wear dentures?: (P) No  CIWA:  CIWA-Ar Total: 1 COWS:     Musculoskeletal: Strength & Muscle Tone: within normal limits Gait & Station: normal Patient leans: N/A  Psychiatric Specialty Exam: Physical Exam  ROS  Blood pressure 104/67, pulse 90, temperature 98 F (36.7 C), temperature source Oral, resp. rate 18, height 5\' 6"  (1.676 m), weight 63 kg, SpO2 100 %.Body mass index is 22.42 kg/m.  General Appearance: Disheveled  Eye Contact:  Good  Speech:  Clear and Coherent  Volume:  Normal  Mood:  Euthymic  Affect:  Appropriate  Thought Process:  Coherent, Linear and Descriptions of Associations: Circumstantial  Orientation:  Full (Time, Place, and Person)  Thought Content:  Logical  Suicidal Thoughts:  No   Homicidal Thoughts:  No  Memory:  Immediate;   Fair Recent;   Fair Remote;   Good  Judgement:  Good  Insight:  Fair  Psychomotor Activity:  Normal  Concentration:  Concentration: Fair and Attention Span: Fair  Recall:  of Knowledge:  Fair  Language:  Fair  Akathisia:  Negative  Handed:  Right  AIMS (if indicated):     Assets:  Communication Skills Desire for Improvement  ADL's:  Intact  Cognition:  WNL  Sleep:  Number of Hours: 5.25     Treatment Plan Summary: Daily contact with patient to assess and evaluate symptoms and progress in treatment and Medication management  No change in meds or precautions seems to have had a resolution in his psychosis probable discharge tomorrow morning  [employer], MD 04/09/2019, 11:31 AM

## 2019-04-09 NOTE — Progress Notes (Signed)
Pt has been cooperative and calm throughout the shift. Pt asked for Trazodone and vistaril before, pt went to bed and slept through out the night without nay problems, will continue to monitor.

## 2019-04-09 NOTE — Progress Notes (Signed)
Recreation Therapy Notes  Date: 11.5.20 Time: 0950 Location: [address] Dayroom  Group Topic: Communication  Goal Area(s) Addresses:  Patient will effectively communicate with peers in group.  Patient will verbalize benefit of healthy communication. Patient will verbalize positive effect of healthy communication on post d/c goals.  Patient will identify communication techniques that made activity effective for group.   Behavioral Response: Engaged  Intervention: Geometric Drawings, pencils, blank paper  Activity: Geometrical Drawings.  Three patients would get the chance to describe a picture to the remainder of the group.  These three individuals could describe their drawings as detailed as possible.  The remainder of the group had to draw the pictures as described to them.  The remainder of the group could not ask any specific questions.  They could only as the presenter to repeat themselves.  Education: Communication, Discharge Planning  Education Outcome: Acknowledges understanding/In group clarification offered/Needs additional education.   Clinical Observations/Feedback:  Pt felt he did great giving instructions to his peers.  Pt explained how someone interprets another person's body language varies by individual because "it could be that person's normal demeanor".  Pt explained a senario where he shared some news with a friend about a new drink coming out but they didn't give him the reaction he was expecting because they don't drink.    Victorino Sparrow, LRT/CTRS     Victorino Sparrow A 04/09/2019 12:06 PM

## 2019-04-09 NOTE — Progress Notes (Signed)
   04/09/19 2100  Psych Admission Type (Psych Patients Only)  Admission Status Involuntary  Psychosocial Assessment  Patient Complaints Anxiety;Agitation  Eye Contact Fair  Facial Expression Animated;Anxious  Affect Anxious;Depressed;Preoccupied  Speech International aid/development worker;Restless;Slow  Appearance/Hygiene Unremarkable  Behavior Characteristics Restless;Anxious  Mood Anxious  Thought Process  Coherency WDL  Content WDL  Delusions None reported or observed  Perception WDL  Hallucination None reported or observed  Judgment Impaired  Confusion Mild  Danger to Self  Current suicidal ideation? Denies  Self-Injurious Behavior No self-injurious ideation or behavior indicators observed or expressed   Agreement Not to Harm Self Yes  Description of Agreement verbal contract for saffety  Danger to Others  Danger to Others None reported or observed   Pt stated his day was a little better, not as up and down as previous days

## 2019-04-09 NOTE — Progress Notes (Signed)
D:  Patient's self inventory sheet, patient has fair sleep, sleep medictiton helpful.  Good appetite, normal energy level, good concentration.  Rated depression and anxiety 2, denied hopeless.  Denied withdrawals.  Denied SI.  Denied physical problems.  Denied physical pain.  Goal is just being comfortable.  Plans to relax.  Does have discharge plans. A:  Medications administered per MD orders.  Emotional support and encouragement given patient. R:  Denied SI and HI, contracts for safety.  Denied A/V hallucinations.  Safety maintained with 15 minute checks.

## 2019-04-10 MED ORDER — BENZTROPINE MESYLATE 0.5 MG PO TABS: 0.5000 mg | ORAL_TABLET | Freq: Two times a day (BID) | ORAL | 0 refills | Status: DC

## 2019-04-10 MED ORDER — RISPERIDONE 3 MG PO TABS: 3.0000 mg | ORAL_TABLET | Freq: Two times a day (BID) | ORAL | 0 refills | Status: DC

## 2019-04-10 MED ORDER — TRAZODONE HCL 100 MG PO TABS: 100.0000 mg | ORAL_TABLET | Freq: Every evening | ORAL | 0 refills | Status: DC | PRN

## 2019-04-10 MED ORDER — CLONIDINE HCL 0.1 MG PO TABS: 0.1000 mg | ORAL_TABLET | Freq: Two times a day (BID) | ORAL | 0 refills | Status: DC

## 2019-04-10 MED ORDER — HYDROXYZINE HCL 25 MG PO TABS: 25.0000 mg | ORAL_TABLET | Freq: Three times a day (TID) | ORAL | 0 refills | Status: DC | PRN

## 2019-04-10 MED ORDER — OMEGA-3-ACID ETHYL ESTERS 1 G PO CAPS: 1.0000 g | ORAL_CAPSULE | Freq: Two times a day (BID) | ORAL | 0 refills | Status: DC

## 2019-04-10 NOTE — Plan of Care (Signed)
Pt was able to identify some coping skills at completion of recreation therapy group sessions.   Westley Blass, LRT/CTRS 

## 2019-04-10 NOTE — Discharge Summary (Signed)
Physician Discharge Summary Note  Patient:  Nathaniel Holland is an 28 y.o., male  MRN:  [id number]  DOB:  Jun 02, 1991  Patient phone:  [phone] (home)   Patient address:   [address] [place] [address],   Total Time spent with patient: Greater than 30 minutes  Date of Admission:  04/07/2019  Date of Discharge: 04-10-19  Reason for Admission: Patient was reported to be suicidal, having auditory hallucinations, becoming aggressive and hostile. There was concern for self-medication.   Principal Problem: Psychotic disorder [facility])  Discharge Diagnoses: Principal Problem:   Psychotic disorder (HCC) Active Problems:   Substance induced mood disorder (HCC)  Past Psychiatric History: Psychotic disorder.  Past Medical History:  Past Medical History:  Diagnosis Date  . Bipolar 1 disorder (HCC)    History reviewed. No pertinent surgical history.  Family History: History reviewed. No pertinent family history.  Family Psychiatric  History: See H&P  Social History:  Social History   Substance and Sexual Activity  Alcohol Use Yes   Comment: "depends on how I feel and how much cannabis I have"     Social History   Substance and Sexual Activity  Drug Use Yes  . Types: Marijuana    Social History   Socioeconomic History  . Marital status: Single    Spouse name: Not on file  . Number of children: Not on file  . Years of education: Not on file  . Highest education level: Not on file  Occupational History  . Not on file  Social Needs  . Financial resource strain: Not on file  . Food insecurity    Worry: Not on file    Inability: Not on file  . Transportation needs    Medical: Not on file    Non-medical: Not on file  Tobacco Use  . Smoking status: Never Smoker  . Smokeless tobacco: Never Used  Substance and Sexual Activity  . Alcohol use: Yes    Comment: "depends on how I feel and how much cannabis I have"  . Drug use: Yes    Types: Marijuana  .  Sexual activity: Yes  Lifestyle  . Physical activity    Days per week: Not on file    Minutes per session: Not on file  . Stress: Not on file  Relationships  . Social Musician on phone: Not on file    Gets together: Not on file    Attends religious service: Not on file    Active member of club or organization: Not on file    Attends meetings of clubs or organizations: Not on file    Relationship status: Not on file  Other Topics Concern  . Not on file  Social History Narrative  . Not on file   Hospital Course: (Per Md's admission evaluation): Patient is a 28 year old male with an unspecified past psychiatric history who was originally brought to the [facility] emergency department on 04/05/2019 under involuntary commitment. The patient was reported to be suicidal, having auditory hallucinations, becoming aggressive and hostile. There was concern for self-medication. The patient did admit that he had been smoking marijuana. The patient story is quite convoluted. Patient stated that he had been using ecstasy for a while, but had not used it recently. He stated he basically uses marijuana on a daily basis. He previously lived in [place], [place]. He was there for approximately 8 years. He has a significant other there who has at least 2 children by  him. Somehow or another he has 2 charges on him therefore is assault on a male which apparently was dismissed, and also a charge of assaulting a Emergency planning/management officerpolice officer. He admitted that he felt paranoid and worried about people following him. He stated he had seen information that made him know that people were after him. He did admit to auditory hallucinations, but sometimes "sometimes I think it is just in my head". After these complicated issues he stated he went to [place], and was arrested there. He was given his an unspecified psychiatric medication there thought to be Zyprexa. He was given a prescription, but he stated he  never filled it. He then came to [place]. He stated his mother and stepfather live here.   After the above admission evaluation, Mikle BosworthCarlos presenting symptoms were identified. He was then recommended for mood stabilization treatments. The, the medication regimen for his presenting symptoms were discussed & with his consent initiated. He received, stabilized & was discharged on the medications as listed below on his discharge medication lists below. He was also enrolled & participated in the group counseling sessions being offered & held on this unit. He learned coping skills. Mikle BosworthCarlos presented on this admission, other medical conditions that required treatment & monitoring. He was resumed/discharged on all his pertinent home medications for those health issues. He tolerated his treatment regimen without any adverse effects or reactions reported.  During the course of his hospitalization, the 15-minute checks were adequate to ensure Misha's safety.  Patient did not display any dangerous, violent or suicidal behavior on the unit.  He interacted with patients & staff appropriately, participated appropriately in the group sessions/therapies. His medications were addressed & adjusted to meet his needs. He was recommended for outpatient follow-up care & medication management upon discharge to assure his continuity of care.  At the time of discharge patient is not reporting any acute suicidal/homicidal ideations. He currently denies any new issues or concerns. Education and supportive counseling provided throughout his hospital stay & upon discharge.  Today upon his discharge evaluation with the attending psychiatrist, Mikle BosworthCarlos shares he is doing well. He denies any other specific concerns. He is sleeping well. His appetite is good. he denies other physical complaints. He denies AH/VH. He feels that his medications have been helpful & is in agreement to continue her current treatment regimen as recommended. He was  able to engage in safety planning including plan to [facility] or contact emergency services if he feels unable to maintain his own safety or the safety of others. Pt had no further questions, comments, or concerns. [facility] belongings in no apparent distress. Transportation per mother..  Physical Findings: AIMS: Facial and Oral Movements Muscles of Facial Expression: None, normal Lips and Perioral Area: None, normal Jaw: None, normal Tongue: None, normal,Extremity Movements Upper (arms, wrists, hands, fingers): None, normal Lower (legs, knees, ankles, toes): None, normal, Trunk Movements Neck, shoulders, hips: None, normal, Overall Severity Severity of abnormal movements (highest score from questions above): None, normal Incapacitation due to abnormal movements: None, normal Patient's awareness of abnormal movements (rate only patient's report): No Awareness, Dental Status Current problems with teeth and/or dentures?: No Does patient usually wear dentures?: No  CIWA:  CIWA-Ar Total: 1 COWS:  COWS Total Score: 0  Musculoskeletal: Strength & Muscle Tone: within normal limits Gait & Station: normal Patient leans: N/A  Psychiatric Specialty Exam: Physical Exam  Nursing note and vitals reviewed. Constitutional: [name], place, and  time. He appears well-developed.  Neck: Normal range of motion.  Cardiovascular: Normal rate and regular rhythm.  Respiratory: Effort normal. No respiratory distress. He has no wheezes.  GI: Soft.  Genitourinary:    Genitourinary Comments: Deferred   Musculoskeletal: Normal range of motion.  Neurological: He is alert and oriented to person, place, and time.  Skin: Skin is warm and dry.    Review of Systems  Constitutional: Negative for chills and fever.  HENT: Negative.   Eyes: Negative.   Respiratory: Negative for cough, shortness of breath and wheezing.   Cardiovascular: Negative for chest pain and  palpitations.  Gastrointestinal: Negative for abdominal pain, heartburn, nausea and vomiting.  Genitourinary: Negative.   Musculoskeletal: Negative.   Skin: Negative.   Neurological: Negative for dizziness and headaches.  Endo/Heme/Allergies: Negative.   Psychiatric/Behavioral: Positive for depression (Stabilized with medication prior to discharge), hallucinations (Hx. Psychosis (stabilized with medication prior to discharge)) and substance abuse (Hx. alcohol & THC use disorders). Negative for memory loss and suicidal ideas. The patient has insomnia (Stabilized with medication prior to discharge). The patient is not nervous/anxious (Stable).     Blood pressure 113/78, pulse 89, temperature 98.4 F (36.9 C), temperature source Oral, resp. rate 18, height  (1.676 m), weight 63 kg, SpO2 100 %.Body mass index is 22.42 kg/m.  See Md's discharge SRA     Has this patient used any form of tobacco in the last 30 days? (Cigarettes, Smokeless Tobacco, Cigars, and/or Pipes): N/A  Blood Alcohol level:  Lab Results  Component Value Date   ETH 133 (H) 04/04/2019   ETH <10 01/20/2019   Metabolic Disorder Labs:  Lab Results  Component Value Date   HGBA1C 5.3 04/08/2019   MPG 105.41 04/08/2019   Lab Results  Component Value Date   PROLACTIN 16.9 (H) 04/08/2019   Lab Results  Component Value Date   CHOL 167 04/08/2019   TRIG 135 04/08/2019   HDL 54 04/08/2019   CHOLHDL 3.1 04/08/2019   VLDL 27 04/08/2019   LDLCALC 86 04/08/2019   See Psychiatric Specialty Exam and Suicide Risk Assessment completed by Attending Physician prior to discharge.  Discharge destination:  Home  Is patient on multiple antipsychotic therapies at discharge:  No   Has Patient had three or more failed trials of antipsychotic monotherapy by history:  No  Recommended Plan for Multiple Antipsychotic Therapies: NA  Allergies as of 04/10/2019   No Known Allergies     Medication List    TAKE these medications      Indication  benztropine 0.5 MG tablet Commonly known as: COGENTIN Take 1 tablet (0.5 mg total) by mouth 2 (two) times daily. For prevention of drug induced tremors.  Indication: Extrapyramidal Reaction caused by Medications   cloNIDine 0.1 MG tablet Commonly known as: CATAPRES Take 1 tablet (0.1 mg total) by mouth 2 (two) times daily. For high blood pressure  Indication: High Blood Pressure Disorder   hydrOXYzine 25 MG tablet Commonly known as: ATARAX/VISTARIL Take 1 tablet (25 mg total) by mouth 3 (three) times daily as needed for anxiety.  Indication: Feeling Anxious   omega-3 acid ethyl esters 1 g capsule Commonly known as: LOVAZA Take 1 capsule (1 g total) by mouth 2 (two) times daily. Vitamin supplement  Indication: Vitamin supplement   risperiDONE 3 MG tablet Commonly known as: RISPERDAL Take 1 tablet (3 mg total) by mouth 2 (two) times daily. For mood control  Indication: Mood control   traZODone 100 MG tablet  Commonly known as: DESYREL Take 1 tablet (100 mg total) by mouth at bedtime as needed for sleep.  Indication: Trouble Sleeping, insomnia      Follow-up Information    [facility] Follow up on 04/22/2019.   Specialty: Professional Counselor Why: Please attend therapy appointment with Dr. Corene Cornea is Wednesday 11/18 at 1:00p.  Contact information: [facility] [place] [address] [phone]          Follow-up recommendations: Activity:  As tolerated Diet: As recommended by your primary care doctor. Keep all scheduled follow-up appointments as recommended.   Comments: Prescriptions given at discharge.  Patient agreeable to plan.  Given opportunity to ask questions.  Appears to feel comfortable with discharge denies any current suicidal or homicidal thought. Patient is also instructed prior to discharge to: Take all medications as prescribed by his/her mental healthcare  provider. Report any adverse effects and or reactions from the medicines to his/her outpatient provider promptly. Patient has been instructed & cautioned: To not engage in alcohol and or illegal drug use while on prescription medicines. In the event of worsening symptoms, patient is instructed to call the crisis hotline, 911 and or go to the nearest ED for appropriate evaluation and treatment of symptoms. To follow-up with his/her primary care provider for your other medical issues, concerns and or health care needs.   Signed: Lindell Spar, NP, PMHNP, FNP-BC 04/10/2019, 11:07 AM

## 2019-04-10 NOTE — Progress Notes (Signed)
Recreation Therapy Notes  Date: 11.6.20 Time: 1000 Location: 500 Hall Dayroom  Group Topic: Communication, Team Building, Problem Solving  Goal Area(s) Addresses:  Patient will effectively work with peer towards shared goal.  Patient will identify skill used to make activity successful.  Patient will identify how skills used during activity can be used to reach post d/c goals.   Behavioral Response:  Engaged  Intervention: STEM Activity   Activity: Metallurgist.  In groups, patients were given 12 pipe cleaner.  Patients were to build a free standing tower as tall as possible using just the pipe cleaner.  Throughout the activity, there were setbacks such as putting one hand behind their backs and not being able to talk.  Education: Education officer, community, Dentist.   Education Outcome: Acknowledges education  Clinical Observations/Feedback:  Pt was focused and smiling during group session.  Pt was able to listen to and offer instructions on completing the tower.  Pt explained the group had to use teamwork to complete the activity.  Pt explained using teamwork with his support system can help to come up with a plan with he is faced with a situation he may need help dealing with.     Victorino Sparrow, LRT/CTRS    Victorino Sparrow A 04/10/2019 11:19 AM

## 2019-04-10 NOTE — Progress Notes (Signed)
Pt discharged to lobby. Pt was stable and appreciative at that time. All papers, samples and prescriptions were given and valuables returned. Verbal understanding expressed. Denies SI/HI and A/VH. Pt given opportunity to express concerns and ask questions.  

## 2019-04-10 NOTE — Progress Notes (Signed)
  [facility] Adult Case Management Discharge Plan :  Will you be returning to the same living situation after discharge:  Yes,  home. At discharge, do you have transportation home?: Yes,  mother will pick up. Do you have the ability to pay for your medications: Yes,  Medicaid and provided samples.  Release of information consent forms completed and in the chart. Letter on chart.  Patient to Follow up at: Follow-up Information    [facility] Follow up on 04/22/2019.   Specialty: Professional Counselor Why: Please attend therapy appointment with Dr. Corene Cornea is Wednesday 11/18 at 1:00p.  Contact information: [facility] [place] [address] [phone]           Next level of care provider has access to [place] and Suicide Prevention discussed: Yes,  mother   Has patient been referred to the Quitline?: N/A patient is not a smoker  Patient has been referred for addiction treatment: Yes  Joellen Jersey, [place] 04/10/2019, 11:19 AM

## 2019-04-10 NOTE — Progress Notes (Signed)
Recreation Therapy Notes  INPATIENT RECREATION TR PLAN  Patient Details Name: Nathaniel Holland MRN: [id number] DOB: December 05, 1990 Today's Date: 04/10/2019  Rec Therapy Plan Is patient appropriate for Therapeutic Recreation?: Yes Treatment times per week: about 3 days Estimated Length of Stay: 5-7 days TR Treatment/Interventions: Group participation (Comment)  Discharge Criteria Pt will be discharged from therapy if:: Discharged Treatment plan/goals/alternatives discussed and agreed upon by:: Patient/family  Discharge Summary Short term goals set: See patient care plan Short term goals met: Adequate for discharge Progress toward goals comments: Groups attended Which groups?: Communication, Other (Comment)(Teambuilding) Reason goals not met: None Therapeutic equipment acquired: N/A Reason patient discharged from therapy: Discharge from hospital Pt/family agrees with progress & goals achieved: Yes Date patient discharged from therapy: 04/10/19    Victorino Sparrow, LRT/CTRS  [name], Thaxton Pelley A 04/10/2019, 11:36 AM

## 2019-04-10 NOTE — [facility] Suicide Risk Assessment (Signed)
[facility] Discharge Suicide Risk Assessment   Principal Problem: <principal problem not specified> Discharge Diagnoses: Active Problems:   Psychotic disorder ([place])   Total Time spent with patient: 20 minutes  Musculoskeletal: Strength & Muscle Tone: within normal limits Gait & Station: normal Patient leans: N/A  Psychiatric Specialty Exam: Review of Systems  All other systems reviewed and are negative.   Blood pressure 113/78, pulse 89, temperature 98.4 F (36.9 C), temperature source Oral, resp. rate 18, height 5\' 6"  (1.676 m), weight 63 kg, SpO2 100 %.Body mass index is 22.42 kg/m.  General Appearance: Casual  Eye Contact::  Good  Speech:  Normal Rate409  Volume:  Normal  Mood:  Euthymic  Affect:  Congruent  Thought Process:  Coherent and Descriptions of Associations: Intact  Orientation:  Full (Time, Place, and Person)  Thought Content:  Logical  Suicidal Thoughts:  No  Homicidal Thoughts:  No  Memory:  Immediate;   Fair Recent;   Fair Remote;   Fair  Judgement:  Intact  Insight:  Fair  Psychomotor Activity:  Normal  Concentration:  Good  Recall:  Good  Fund of Knowledge:Good  Language: Good  Akathisia:  Negative  Handed:  Right  AIMS (if indicated):     Assets:  Desire for Improvement Resilience Social Support  Sleep:  Number of Hours: 5.25  Cognition: WNL  ADL's:  Intact   Mental Status Per Nursing Assessment::   On Admission:  Suicidal ideation indicated by patient, Self-harm thoughts  Demographic Factors:  Male, Caucasian, Low socioeconomic status and Unemployed  Loss Factors: NA  Historical Factors: Impulsivity  Risk Reduction Factors:   Sense of responsibility to family, Living with another person, especially a relative and Positive social support  Continued Clinical Symptoms:  Alcohol/Substance Abuse/Dependencies Currently Psychotic  Cognitive Features That Contribute To Risk:  None    Suicide Risk:  Minimal: No identifiable suicidal  ideation.  Patients presenting with no risk factors but with morbid ruminations; may be classified as minimal risk based on the severity of the depressive symptoms  Follow-up [facility] Follow up on 04/22/2019.   Specialty: Professional Counselor Why: Please attend therapy appointment with Dr. Corene Cornea is Wednesday 11/18 at 1:00p.  Contact information: [facility] [place] [address] [phone]           Plan Of Care/Follow-up recommendations:  Activity:  ad lib  Sharma Covert, MD 04/10/2019, 10:41 AM

## 2019-04-22 ENCOUNTER — Emergency Department (HOSPITAL_COMMUNITY)
Admission: EM | Admit: 2019-04-22 | Discharge: 2019-04-22 | Payer: Medicaid Other | Attending: Emergency Medicine | Admitting: Emergency Medicine

## 2019-04-22 ENCOUNTER — Other Ambulatory Visit: Payer: Self-pay

## 2019-04-22 DIAGNOSIS — Y9389 Activity, other specified: Secondary | ICD-10-CM | POA: Diagnosis not present

## 2019-04-22 DIAGNOSIS — Y92810 Car as the place of occurrence of the external cause: Secondary | ICD-10-CM | POA: Insufficient documentation

## 2019-04-22 DIAGNOSIS — Y999 Unspecified external cause status: Secondary | ICD-10-CM | POA: Diagnosis not present

## 2019-04-22 DIAGNOSIS — W2209XA Striking against other stationary object, initial encounter: Secondary | ICD-10-CM | POA: Insufficient documentation

## 2019-04-22 DIAGNOSIS — S0990XA Unspecified injury of head, initial encounter: Secondary | ICD-10-CM

## 2019-04-22 DIAGNOSIS — S0181XA Laceration without foreign body of other part of head, initial encounter: Secondary | ICD-10-CM | POA: Insufficient documentation

## 2019-04-22 NOTE — ED Notes (Addendum)
Pt refused to sign for discharge 

## 2019-04-22 NOTE — Discharge Instructions (Addendum)
Return to ER if he has any loss of consciousness, vomiting, further bleeding or other new concerning symptom.
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
## 2019-04-22 NOTE — ED Notes (Signed)
RN reviewed d/c paperwork with [employer] and he was taken into their custody
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
## 2019-04-23 ENCOUNTER — Emergency Department (HOSPITAL_COMMUNITY): Payer: Medicaid Other

## 2019-04-23 ENCOUNTER — Encounter (HOSPITAL_COMMUNITY): Payer: Self-pay | Admitting: Emergency Medicine

## 2019-04-23 ENCOUNTER — Emergency Department (HOSPITAL_COMMUNITY)
Admission: EM | Admit: 2019-04-23 | Discharge: 2019-04-23 | Disposition: A | Discharge status: Home / Self Care | Payer: Medicaid Other | Attending: Emergency Medicine | Admitting: Emergency Medicine

## 2019-04-23 DIAGNOSIS — R0789 Other chest pain: Secondary | ICD-10-CM | POA: Diagnosis present

## 2019-04-23 DIAGNOSIS — Z79899 Other long term (current) drug therapy: Secondary | ICD-10-CM | POA: Insufficient documentation

## 2019-04-23 DIAGNOSIS — R0781 Pleurodynia: Secondary | ICD-10-CM | POA: Insufficient documentation

## 2019-04-23 DIAGNOSIS — X58XXXD Exposure to other specified factors, subsequent encounter: Secondary | ICD-10-CM | POA: Insufficient documentation

## 2019-04-23 MED ORDER — ACETAMINOPHEN 500 MG PO TABS
1000.0000 mg | ORAL_TABLET | Freq: Once | ORAL | Status: AC
  Administered 2019-04-23: 1000 mg via ORAL
  Filled 2019-04-23: qty 2

## 2019-04-23 MED ORDER — LIDOCAINE 5 % EX PTCH
2.0000 | MEDICATED_PATCH | CUTANEOUS | Status: DC
  Administered 2019-04-23: 2 via TRANSDERMAL
  Filled 2019-04-23: qty 2
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
## 2019-04-23 NOTE — ED Notes (Signed)
Pt was verbalized discharge instructions. Pt had no further questions at this time. NAD. 

## 2019-12-26 ENCOUNTER — Other Ambulatory Visit: Payer: Self-pay

## 2019-12-26 ENCOUNTER — Emergency Department (HOSPITAL_COMMUNITY)
Admission: EM | Admit: 2019-12-26 | Discharge: 2019-12-27 | Disposition: A | Discharge status: Other Acute Inpatient Hospital | Payer: Medicaid Other | Attending: Emergency Medicine | Admitting: Emergency Medicine

## 2019-12-26 ENCOUNTER — Encounter (HOSPITAL_COMMUNITY): Payer: Self-pay

## 2019-12-26 DIAGNOSIS — Z20822 Contact with and (suspected) exposure to covid-19: Secondary | ICD-10-CM | POA: Diagnosis not present

## 2019-12-26 DIAGNOSIS — F12988 Cannabis use, unspecified with other cannabis-induced disorder: Secondary | ICD-10-CM | POA: Insufficient documentation

## 2019-12-26 DIAGNOSIS — F1910 Other psychoactive substance abuse, uncomplicated: Secondary | ICD-10-CM | POA: Insufficient documentation

## 2019-12-26 DIAGNOSIS — F319 Bipolar disorder, unspecified: Secondary | ICD-10-CM | POA: Insufficient documentation

## 2019-12-26 DIAGNOSIS — F600 Paranoid personality disorder: Secondary | ICD-10-CM | POA: Diagnosis not present

## 2019-12-26 DIAGNOSIS — F22 Delusional disorders: Secondary | ICD-10-CM | POA: Diagnosis not present

## 2019-12-26 DIAGNOSIS — R440 Auditory hallucinations: Secondary | ICD-10-CM | POA: Diagnosis present

## 2019-12-26 LAB — CBC WITH DIFFERENTIAL/PLATELET
Abs Immature Granulocytes: 0.03 10*3/uL (ref 0.00–0.07)
Basophils Absolute: 0.1 10*3/uL (ref 0.0–0.1)
Basophils Relative: 1 %
Eosinophils Absolute: 0.1 10*3/uL (ref 0.0–0.5)
Eosinophils Relative: 1 %
HCT: 45.1 % (ref 39.0–52.0)
Hemoglobin: 14.9 g/dL (ref 13.0–17.0)
Immature Granulocytes: 0 %
Lymphocytes Relative: 17 %
Lymphs Abs: 1.6 10*3/uL (ref 0.7–4.0)
MCH: 30.1 pg (ref 26.0–34.0)
MCHC: 33 g/dL (ref 30.0–36.0)
MCV: 91.1 fL (ref 80.0–100.0)
Monocytes Absolute: 1.5 10*3/uL — ABNORMAL HIGH (ref 0.1–1.0)
Monocytes Relative: 16 %
Neutro Abs: 6.1 10*3/uL (ref 1.7–7.7)
Neutrophils Relative %: 65 %
Platelets: 293 10*3/uL (ref 150–400)
RBC: 4.95 MIL/uL (ref 4.22–5.81)
RDW: 13.1 % (ref 11.5–15.5)
WBC: 9.3 10*3/uL (ref 4.0–10.5)
nRBC: 0 % (ref 0.0–0.2)

## 2019-12-26 LAB — COMPREHENSIVE METABOLIC PANEL
ALT: 23 U/L (ref 0–44)
AST: 26 U/L (ref 15–41)
Albumin: 5.1 g/dL — ABNORMAL HIGH (ref 3.5–5.0)
Alkaline Phosphatase: 91 U/L (ref 38–126)
Anion gap: 14 (ref 5–15)
BUN: 17 mg/dL (ref 6–20)
CO2: 21 mmol/L — ABNORMAL LOW (ref 22–32)
Calcium: 9.7 mg/dL (ref 8.9–10.3)
Chloride: 104 mmol/L (ref 98–111)
Creatinine, Ser: 0.9 mg/dL (ref 0.61–1.24)
GFR calc Af Amer: >60 mL/min (ref >60)
GFR calc non Af Amer: >60 mL/min (ref >60)
Glucose, Bld: 120 mg/dL — ABNORMAL HIGH (ref 70–99)
Potassium: 3.5 mmol/L (ref 3.5–5.1)
Sodium: 139 mmol/L (ref 135–145)
Total Bilirubin: 1.1 mg/dL (ref 0.3–1.2)
Total Protein: 8.7 g/dL — ABNORMAL HIGH (ref 6.5–8.1)

## 2019-12-26 LAB — ETHANOL: Alcohol, Ethyl (B): <10 mg/dL (ref <10)

## 2019-12-26 LAB — SALICYLATE LEVEL: Salicylate Lvl: <7 mg/dL — ABNORMAL LOW (ref 7.0–30.0)

## 2019-12-26 LAB — ACETAMINOPHEN LEVEL: Acetaminophen (Tylenol), Serum: <10 ug/mL — ABNORMAL LOW (ref 10–30)

## 2019-12-26 MED ORDER — RISPERIDONE 2 MG PO TABS
3.0000 mg | ORAL_TABLET | Freq: Once | ORAL | Status: AC
  Administered 2019-12-26: 3 mg via ORAL
  Filled 2019-12-26: qty 1

## 2019-12-26 MED ORDER — TRAZODONE HCL 50 MG PO TABS
50.0000 mg | ORAL_TABLET | Freq: Every day | ORAL | Status: DC
  Administered 2019-12-26: 50 mg via ORAL
  Filled 2019-12-26: qty 1

## 2019-12-26 NOTE — ED Notes (Signed)
Unable to collect labs at this time.  Patient want sit down or still
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
## 2019-12-26 NOTE — ED Notes (Addendum)
Patient placed in burgundy scrubs. Belongings in cabinet. Police at bedside.

## 2019-12-26 NOTE — ED Notes (Signed)
Sitter at bedside.

## 2019-12-26 NOTE — ED Triage Notes (Signed)
He called EMS as he found himself to be uncontrollably overactive (manic). He arrives in handcuffs with a Conservator, museum/gallery. He is healthy-looking and in no distress.

## 2019-12-26 NOTE — ED Notes (Addendum)
Belongings are labeled and moved to 9-12 patient belonging section   Belongings: 1 black cell phone,  1 grey joggers, pair of black socks, 1 white tee shirt, 1 white tank. Pair of blue slip on bedroom shoes, 1 key, 1 torn social security card, 1 ID card  Nurse aware

## 2019-12-27 ENCOUNTER — Other Ambulatory Visit: Payer: Self-pay

## 2019-12-27 ENCOUNTER — Encounter (HOSPITAL_COMMUNITY): Payer: Self-pay | Admitting: Psychiatry

## 2019-12-27 ENCOUNTER — Inpatient Hospital Stay (HOSPITAL_COMMUNITY)
Admission: AD | Admit: 2019-12-27 | Discharge: 2019-12-31 | DRG: 897 | Disposition: A | Discharge status: Home / Self Care | Payer: Medicaid Other | Source: Other Acute Inpatient Hospital | Attending: Psychiatry | Admitting: Psychiatry

## 2019-12-27 DIAGNOSIS — F29 Unspecified psychosis not due to a substance or known physiological condition: Secondary | ICD-10-CM | POA: Diagnosis present

## 2019-12-27 DIAGNOSIS — F12951 Cannabis use, unspecified with psychotic disorder with hallucinations: Secondary | ICD-10-CM | POA: Diagnosis present

## 2019-12-27 DIAGNOSIS — F19959 Other psychoactive substance use, unspecified with psychoactive substance-induced psychotic disorder, unspecified: Secondary | ICD-10-CM | POA: Diagnosis not present

## 2019-12-27 DIAGNOSIS — F209 Schizophrenia, unspecified: Secondary | ICD-10-CM | POA: Diagnosis present

## 2019-12-27 DIAGNOSIS — F12950 Cannabis use, unspecified with psychotic disorder with delusions: Principal | ICD-10-CM | POA: Diagnosis present

## 2019-12-27 DIAGNOSIS — F1994 Other psychoactive substance use, unspecified with psychoactive substance-induced mood disorder: Secondary | ICD-10-CM | POA: Diagnosis present

## 2019-12-27 DIAGNOSIS — F1910 Other psychoactive substance abuse, uncomplicated: Secondary | ICD-10-CM | POA: Diagnosis not present

## 2019-12-27 LAB — SARS CORONAVIRUS 2 BY RT PCR (HOSPITAL ORDER, PERFORMED IN ~~LOC~~ HOSPITAL LAB): SARS Coronavirus 2: NEGATIVE

## 2019-12-27 MED ORDER — CLONIDINE HCL 0.1 MG PO TABS
0.1000 mg | ORAL_TABLET | Freq: Two times a day (BID) | ORAL | Status: DC
  Administered 2019-12-27: 0.1 mg via ORAL
  Filled 2019-12-27 (×5): qty 1

## 2019-12-27 MED ORDER — RISPERIDONE 2 MG PO TABS
2.0000 mg | ORAL_TABLET | Freq: Every day | ORAL | Status: DC
  Administered 2019-12-27 – 2019-12-30 (×4): 2 mg via ORAL
  Filled 2019-12-27 (×6): qty 1

## 2019-12-27 MED ORDER — ZIPRASIDONE MESYLATE 20 MG IM SOLR: 10.0000 mg | INTRAMUSCULAR | Status: DC | PRN

## 2019-12-27 MED ORDER — MAGNESIUM HYDROXIDE 400 MG/5ML PO SUSP: 30.0000 mL | Freq: Every day | ORAL | Status: DC | PRN

## 2019-12-27 MED ORDER — BENZTROPINE MESYLATE 0.5 MG PO TABS: 0.5000 mg | ORAL_TABLET | Freq: Two times a day (BID) | ORAL | Status: DC | PRN

## 2019-12-27 MED ORDER — BENZTROPINE MESYLATE 0.5 MG PO TABS
0.5000 mg | ORAL_TABLET | Freq: Two times a day (BID) | ORAL | Status: DC
  Administered 2019-12-27: 0.5 mg via ORAL
  Filled 2019-12-27 (×5): qty 1

## 2019-12-27 MED ORDER — RISPERIDONE 2 MG PO TBDP
2.0000 mg | ORAL_TABLET | Freq: Three times a day (TID) | ORAL | Status: DC | PRN
  Administered 2019-12-30 – 2019-12-31 (×2): 2 mg via ORAL
  Filled 2019-12-27 (×2): qty 2

## 2019-12-27 MED ORDER — HYDROXYZINE HCL 25 MG PO TABS
25.0000 mg | ORAL_TABLET | Freq: Three times a day (TID) | ORAL | Status: DC | PRN
  Administered 2019-12-28 – 2019-12-29 (×2): 25 mg via ORAL
  Filled 2019-12-27 (×2): qty 1

## 2019-12-27 MED ORDER — RISPERIDONE 3 MG PO TABS
3.0000 mg | ORAL_TABLET | Freq: Two times a day (BID) | ORAL | Status: DC
  Administered 2019-12-27: 3 mg via ORAL
  Filled 2019-12-27 (×5): qty 1

## 2019-12-27 MED ORDER — ACETAMINOPHEN 325 MG PO TABS: 650.0000 mg | ORAL_TABLET | Freq: Four times a day (QID) | ORAL | Status: DC | PRN

## 2019-12-27 MED ORDER — TRAZODONE HCL 100 MG PO TABS: 100.0000 mg | ORAL_TABLET | Freq: Every evening | ORAL | Status: DC | PRN

## 2019-12-27 MED ORDER — LORAZEPAM 1 MG PO TABS
1.0000 mg | ORAL_TABLET | ORAL | Status: AC | PRN
  Administered 2019-12-30: 1 mg via ORAL
  Filled 2019-12-27: qty 1

## 2019-12-27 MED ORDER — RISPERIDONE 1 MG PO TABS
1.0000 mg | ORAL_TABLET | ORAL | Status: DC
  Administered 2019-12-28 – 2019-12-31 (×4): 1 mg via ORAL
  Filled 2019-12-27 (×5): qty 1

## 2019-12-27 MED ORDER — OMEGA-3-ACID ETHYL ESTERS 1 G PO CAPS
1.0000 g | ORAL_CAPSULE | Freq: Two times a day (BID) | ORAL | Status: DC
  Administered 2019-12-27 – 2019-12-31 (×8): 1 g via ORAL
  Filled 2019-12-27 (×11): qty 1

## 2019-12-27 MED ORDER — TRAZODONE HCL 50 MG PO TABS
50.0000 mg | ORAL_TABLET | Freq: Every evening | ORAL | Status: DC | PRN
  Administered 2019-12-28 – 2019-12-30 (×3): 50 mg via ORAL
  Filled 2019-12-27 (×4): qty 1

## 2019-12-27 MED ORDER — ALUM & MAG HYDROXIDE-SIMETH 200-200-20 MG/5ML PO SUSP: 30.0000 mL | ORAL | Status: DC | PRN
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
## 2019-12-27 NOTE — Progress Notes (Signed)
Pt accepted to [facility] Room 505-01 to the service of MD Cobos, pending negative COVID result. Pt may come at 0800 hours, report to be called to [phone] when transportation is arranged.

## 2019-12-27 NOTE — Progress Notes (Signed)
   12/27/19 2115  COVID-19 Daily Checkoff  Have you had a fever (temp > 37.80C/100F)  in the past 24 hours?  No  If you have had runny nose, nasal congestion, sneezing in the past 24 hours, has it worsened? No  COVID-19 EXPOSURE  Have you traveled outside the state in the past 14 days? No  Have you been in contact with someone with a confirmed diagnosis of COVID-19 or PUI in the past 14 days without wearing appropriate PPE? No  Have you been living in the same home as a person with confirmed diagnosis of COVID-19 or a PUI (household contact)? No  Have you been diagnosed with COVID-19? No

## 2019-12-27 NOTE — ED Notes (Signed)
Transported to [facility] by [employer]. Belongings given to [employer]. Pt was calm and cooperative with the transport.

## 2019-12-27 NOTE — ED Notes (Signed)
Report called to Casimiro Needle RN at [facility] and [employer] called for transport.
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
## 2019-12-27 NOTE — Tx Team (Cosign Needed)
Initial Treatment Plan 12/27/2019 1:13 PM Nathaniel Holland [id number]    PATIENT STRESSORS: Health problems Medication change or noncompliance   PATIENT STRENGTHS: Ability for insight Communication skills Motivation for treatment/growth Physical Health Supportive family/friends   PATIENT IDENTIFIED PROBLEMS: anxiety  Medication management  Drug use/abuse  avh               DISCHARGE CRITERIA:  Ability to meet basic life and health needs Improved stabilization in mood, thinking, and/or behavior Motivation to continue treatment in a less acute level of care Need for constant or close observation no longer present  PRELIMINARY DISCHARGE PLAN: Attend aftercare/continuing care group Outpatient therapy Return to previous living arrangement Return to previous work or school arrangements  PATIENT/FAMILY INVOLVEMENT: This treatment plan has been presented to and reviewed with the patient, Nathaniel Holland.  The patient and family have been given the opportunity to ask questions and make suggestions.  Raylene Miyamoto, RN 12/27/2019, 1:13 PM

## 2019-12-28 LAB — LIPID PANEL
Cholesterol: 158 mg/dL (ref 0–200)
HDL: 56 mg/dL (ref >40)
LDL Cholesterol: 89 mg/dL (ref 0–99)
Total CHOL/HDL Ratio: 2.8 RATIO
Triglycerides: 65 mg/dL (ref <150)
VLDL: 13 mg/dL (ref 0–40)

## 2019-12-28 LAB — TSH: TSH: 1.157 u[IU]/mL (ref 0.350–4.500)

## 2019-12-28 LAB — HEMOGLOBIN A1C
Hgb A1c MFr Bld: 5.4 % (ref 4.8–5.6)
Mean Plasma Glucose: 108.28 mg/dL

## 2019-12-28 NOTE — Tx Team (Cosign Needed)
Interdisciplinary Treatment and Diagnostic Plan Update  12/28/2019 Time of Session: 11:15am Nathaniel Holland MRN: [id number]  Principal Diagnosis: <principal problem not specified>  Secondary Diagnoses: Active Problems:   Substance-induced psychotic disorder ([place])   Current Medications:  Current Facility-Administered Medications  Medication Dose Route Frequency Provider Last Rate Last Admin  . acetaminophen (TYLENOL) tablet 650 mg  650 mg Oral Q6H PRN Rozetta Nunnery, NP      . alum & mag hydroxide-simeth (MAALOX/MYLANTA) 200-200-20 MG/5ML suspension 30 mL  30 mL Oral Q4H PRN Lindon Romp A, NP      . benztropine (COGENTIN) tablet 0.5 mg  0.5 mg Oral BID PRN Cobos, Myer Peer, MD      . hydrOXYzine (ATARAX/VISTARIL) tablet 25 mg  25 mg Oral TID PRN Lindon Romp A, NP      . risperiDONE (RISPERDAL M-TABS) disintegrating tablet 2 mg  2 mg Oral Q8H PRN Cobos, Myer Peer, MD       And  . LORazepam (ATIVAN) tablet 1 mg  1 mg Oral PRN Cobos, Myer Peer, MD       And  . ziprasidone (GEODON) injection 10 mg  10 mg Intramuscular PRN Cobos, Fernando A, MD      . omega-3 acid ethyl esters (LOVAZA) capsule 1 g  1 g Oral BID Lindon Romp A, NP   1 g at 12/28/19 0751  . risperiDONE (RISPERDAL) tablet 1 mg  1 mg Oral BH-q7a Cobos, Myer Peer, MD   1 mg at 12/28/19 0751  . risperiDONE (RISPERDAL) tablet 2 mg  2 mg Oral QHS Cobos, Myer Peer, MD   2 mg at 12/27/19 2109  . traZODone (DESYREL) tablet 50 mg  50 mg Oral QHS PRN Cobos, Myer Peer, MD       PTA Medications: Medications Prior to Admission  Medication Sig Dispense Refill Last Dose  . benztropine (COGENTIN) 0.5 MG tablet Take 1 tablet (0.5 mg total) by mouth 2 (two) times daily. For prevention of drug induced tremors. (Patient not taking: Reported on 12/26/2019) 60 tablet 0   . cloNIDine (CATAPRES) 0.1 MG tablet Take 1 tablet (0.1 mg total) by mouth 2 (two) times daily. For high blood pressure (Patient not taking: Reported on 12/26/2019) 60 tablet 0    . hydrOXYzine (ATARAX/VISTARIL) 25 MG tablet Take 1 tablet (25 mg total) by mouth 3 (three) times daily as needed for anxiety. (Patient not taking: Reported on 12/26/2019) 75 tablet 0   . omega-3 acid ethyl esters (LOVAZA) 1 g capsule Take 1 capsule (1 g total) by mouth 2 (two) times daily. Vitamin supplement (Patient not taking: Reported on 12/26/2019) 60 capsule 0   . risperiDONE (RISPERDAL) 3 MG tablet Take 1 tablet (3 mg total) by mouth 2 (two) times daily. For mood control (Patient not taking: Reported on 12/26/2019) 60 tablet 0   . traZODone (DESYREL) 100 MG tablet Take 1 tablet (100 mg total) by mouth at bedtime as needed for sleep. 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   Physician Treatment Plan for Primary Diagnosis: <principal problem not specified> Long Term Goal(s): Improvement in symptoms so as ready for discharge Improvement in symptoms so as ready for discharge   Short Term Goals: Ability to  identify changes in lifestyle to reduce recurrence of condition will improve Ability to verbalize feelings will improve Ability to disclose and discuss suicidal ideas Ability to demonstrate self-control will improve Ability to identify and develop effective coping behaviors will improve Ability to maintain clinical measurements within normal limits will improve Ability to identify changes in lifestyle to reduce recurrence of condition will improve Compliance with prescribed medications will improve Ability to identify triggers associated with substance abuse/mental health issues will improve  Medication  Management: Evaluate patient's response, side effects, and tolerance of medication regimen.  Therapeutic Interventions: 1 to 1 sessions, Unit Group sessions and Medication administration.  Evaluation of Outcomes: Not Met  Physician Treatment Plan for Secondary Diagnosis: Active Problems:   Substance-induced psychotic disorder ([place])  Long Term Goal(s): Improvement in symptoms so as ready for discharge Improvement in symptoms so as ready for discharge   Short Term Goals: Ability to identify changes in lifestyle to reduce recurrence of condition will improve Ability to verbalize feelings will improve Ability to disclose and discuss suicidal ideas Ability to demonstrate self-control will improve Ability to identify and develop effective coping behaviors will improve Ability to maintain clinical measurements within normal limits will improve Ability to identify changes in lifestyle to reduce recurrence of condition will improve Compliance with prescribed medications will improve Ability to identify triggers associated with substance abuse/mental health issues will improve     Medication Management: Evaluate patient's response, side effects, and tolerance of medication regimen.  Therapeutic Interventions: 1 to 1 sessions, Unit Group sessions and Medication administration.  Evaluation of Outcomes: Not Met   RN Treatment Plan for Primary Diagnosis: <principal problem not specified> Long Term Goal(s): Knowledge of disease and therapeutic regimen to maintain health will improve  Short Term Goals: Ability to remain free from injury will improve, Ability to verbalize frustration and anger appropriately will improve, Ability to participate in decision making will improve, Ability to identify and develop effective coping behaviors will improve and Compliance with prescribed medications will improve  Medication Management: RN will administer medications as ordered by provider, will assess and  evaluate patient's response and provide education to patient for prescribed medication. RN will report any adverse and/or side effects to prescribing provider.  Therapeutic Interventions: 1 on 1 counseling sessions, Psychoeducation, Medication administration, Evaluate responses to treatment, Monitor vital signs and CBGs as ordered, Perform/monitor CIWA, COWS, AIMS and Fall Risk screenings as ordered, Perform wound care treatments as ordered.  Evaluation of Outcomes: Not Met   LCSW Treatment Plan for Primary Diagnosis: <principal problem not specified> Long Term Goal(s): Safe transition to appropriate next level of care at discharge, Engage patient in therapeutic group addressing interpersonal concerns.  Short Term Goals: Engage patient in aftercare planning with referrals and resources, Increase social support, Identify triggers associated with mental health/substance abuse issues and Increase skills for wellness and recovery  Therapeutic Interventions: Assess for all discharge needs, 1 to 1 time with Social worker, Explore available resources and support systems, Assess for adequacy in community support network, Educate family and significant other(s) on suicide prevention, Complete Psychosocial Assessment, Interpersonal group therapy.  Evaluation of Outcomes: Not Met   Progress in Treatment: Attending groups: No. Participating in groups: No. Taking medication as prescribed: Yes. Toleration medication: Yes. Family/Significant other contact made: No, will contact:  if consent is provided. Patient understands diagnosis: Yes. Discussing patient identified problems/goals with staff: Yes. Medical problems stabilized or resolved: Yes. Denies suicidal/homicidal ideation: Yes. Issues/concerns per patient self-inventory: No.  New problem(s) identified: No,  Describe:  none.  New Short Term/Long Term Goal(s): medication stabilization, elimination of SI thoughts, development of comprehensive  mental wellness plan.   Patient Goals:  "I want to understand my thinking process so I can be a better person."  Discharge Plan or Barriers: Patient recently admitted. CSW will continue to follow and assess for appropriate referrals and possible discharge planning.   Reason for Continuation of Hospitalization: Delusions  Hallucinations Medication stabilization  Estimated Length of Stay: 3-5 days  Attendees: Patient: Nathaniel Holland 12/28/2019  Physician: Dr. Parke Poisson 12/28/2019  Nursing:  12/28/2019   RN Care Manager: 12/28/2019  Social Worker: Darletta Moll, [place] 12/28/2019   Recreational Therapist:  12/28/2019   Other:  12/28/2019   Other:  12/28/2019   Other: 12/28/2019       Scribe for Treatment Team: Vassie Moselle, LCSW 12/28/2019 11:48 AM

## 2019-12-28 NOTE — Progress Notes (Signed)
Recreation Therapy Notes  Date: 7.26.21 Time: 1000 Location: 500 Hall Dayroom  Group Topic: Wellness  Goal Area(s) Addresses:  Patient will define components of whole wellness. Patient will verbalize benefit of whole wellness.  Behavioral Response: Engaged  Intervention: Music  Activity: Exercise.  LRT led patients in a series of stretches to loosen up.  Patients then took turns leading group in various exercises.  Patients were told to get water and take breaks as needed.  Patients were also given the opportunity to request songs they wanted to hear when the exercises were done.  Education: Wellness, Building control surveyor.   Education Outcome: Acknowledges education/In group clarification offered/Needs additional education.   Clinical Observations/Feedback: Pt was bright and fully active with the activity.  Pt led group in a number of different stretches. Pt also request a song he wanted to hear.  Pt was engaged and social with peers.    [name], LRT/CTRS    [name] A 12/28/2019 12:19 PM

## 2019-12-28 NOTE — Progress Notes (Signed)
Patient has been in his room resting most of the night. Compliant with scheduled medication   12/27/19 2115  Psych Admission Type (Psych Patients Only)  Admission Status Involuntary  Psychosocial Assessment  Patient Complaints Hopelessness;Depression  Eye Contact Brief  Facial Expression Worried  Affect Sad;Sullen;Appropriate to circumstance  Speech Logical/coherent  Interaction Minimal  Motor Activity Slow  Appearance/Hygiene In scrubs  Behavior Characteristics Cooperative;Appropriate to situation  Mood Pleasant;Depressed  Thought Process  Coherency Blocking  Content Preoccupation;Paranoia  Delusions Paranoid  Perception Hallucinations  Hallucination Auditory;Visual  Judgment Poor  Confusion None  Danger to Self  Current suicidal ideation? Denies  Danger to Others  Danger to Others None reported or observed  .

## 2019-12-28 NOTE — Progress Notes (Signed)
Recreation Therapy Notes  Patient admitted to unit 7.25.21. Due to admission within last year, no new assessment conducted at this time. Last assessment conducted 11.4.20. Patient reports stressors as court case wanting to see his kids before they go back to school. Patient reported reason for admission was wanting to get more medications and get re-evaluated.  Patient strengths identified as patience and willingness to understand others and self.  Area of improvement identified as bi-polar.   Patient denies SI, HI, AVH at this time. Patient reports goal of continuing to better self and understand things.  Information found below from assessment conducted 11.4.20   Coping Skills:  Isolation, Journal, Sports, TV, Music, Exercise, Meditate, Deep Breathing, Substance Abuse, Talk, Art, Prayer, Avoidance, Hot Bath/Shower   Leisure Interests:  Family, Fishing, Eating      Nathaniel Holland, Nathaniel Holland    [name] A 12/28/2019 12:47 PM

## 2019-12-28 NOTE — [facility] Suicide Risk Assessment (Signed)
[facility] INPATIENT:  Family/Significant Other Suicide Prevention Education   Suicide Prevention Education:  Contact Attempts: Radek Carnero has been identified by the patient as the family member/significant other with whom the patient will be residing, and identified as the person(s) who will aid the patient in the event of a mental health crisis.  With written consent from the patient, two attempts were made to provide suicide prevention education, prior to and/or following the patient's discharge.  We were unsuccessful in providing suicide prevention education.  A suicide education pamphlet was given to the patient to share with family/significant other.   Date and time of first attempt: 12/28/19 3:38PM CSW left a HIPAA compliant voicemail for Malek Skog.    Ruthann Cancer MSW, LCSW Clincal Social Worker  [facility]
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
## 2019-12-28 NOTE — Progress Notes (Signed)
   12/28/19 2010  Psych Admission Type (Psych Patients Only)  Admission Status Involuntary  Psychosocial Assessment  Patient Complaints Anxiety  Eye Contact Fair  Facial Expression Animated  Affect Anxious  Speech Logical/coherent  Interaction Assertive  Motor Activity Other (Comment) (WNL)  Appearance/Hygiene Unremarkable  Behavior Characteristics Cooperative  Mood Anxious;Pleasant  Thought Process  Coherency WDL  Content WDL  Delusions None reported or observed  Perception WDL  Hallucination None reported or observed  Judgment Poor  Confusion None  Danger to Self  Current suicidal ideation? Denies  Danger to Others  Danger to Others None reported or observed   Pt seen in room. Pt endorses anxiety 5/10. Pt states he feels better today and has been using coping skills. "I was medicating with Marijuana but I want to start medicating with legal medication."
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
## 2019-12-29 MED ORDER — SERTRALINE HCL 25 MG PO TABS
25.0000 mg | ORAL_TABLET | Freq: Every day | ORAL | Status: DC
  Administered 2019-12-30 – 2019-12-31 (×2): 25 mg via ORAL
  Filled 2019-12-29 (×3): qty 1
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
## 2019-12-29 NOTE — Progress Notes (Addendum)
   12/29/19 2045  Psych Admission Type (Psych Patients Only)  Admission Status Involuntary  Psychosocial Assessment  Patient Complaints Anxiety  Eye Contact Fair  Facial Expression Animated  Affect Anxious  Speech Logical/coherent  Interaction Assertive  Motor Activity Other (Comment) (WNL)  Appearance/Hygiene Unremarkable  Behavior Characteristics Cooperative  Mood Anxious;Pleasant  Thought Process  Coherency WDL  Content WDL  Delusions None reported or observed  Perception WDL  Hallucination None reported or observed  Judgment Poor  Confusion None  Danger to Self  Current suicidal ideation? Denies  Danger to Others  Danger to Others None reported or observed   Pt rates anxiety 2/10. States he feels good today and is looking forward to the future. Wants to do the right thing but feels that it is hard. Pt commended for his insight and encouraged not to let the difficulty deter him from getting his life straight.
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
## 2019-12-30 NOTE — Progress Notes (Signed)
Recreation Therapy Notes  Date: 7.28.21 Time: 0955 Location: [address] Dayroom   Group Topic: Communication, Team Building, Problem Solving  Goal Area(s) Addresses:  Patient will effectively work with peer towards shared goal.  Patient will identify skills used to make activity successful.  Patient will identify how skills used during activity can be used to reach post d/c goals.   Behavioral Response: Engaged  Intervention: STEM Activity  Activity: Straw Bridge.  In groups, patients were given 15 straws and 2 feet of masking tape.  Using the supplies given, patients were to construct an elevated bridge that could hold a small puzzle box.  Education: Pharmacist, community, Discharge Planning   Education Outcome: Acknowledges education/In group clarification offered/Needs additional education.   Clinical Observations/Feedback:  Pt got off to a slow start helping the group construct the bridge.  Pt later explained he didn't quite understand what to do.  Once pt got going, pt was able to come up with a concept the group latched onto and was able to construct the bridge.  Pt explained during processing, "sometimes you just have to get in there" even with your support system.  Pt was explaining you may not always know where to start or what to do but just start putting things together and it will work out.     [name], LRT/CTRS    Lillia Abed, Terianna Peggs A 12/30/2019 10:52 AM

## 2019-12-30 NOTE — Progress Notes (Signed)
   12/30/19 0900  Psych Admission Type (Psych Patients Only)  Admission Status Involuntary  Psychosocial Assessment  Patient Complaints Anxiety  Eye Contact Fair  Facial Expression Animated  Affect Anxious  Speech Logical/coherent  Interaction Assertive  Motor Activity Other (Comment) (WNL)  Appearance/Hygiene Unremarkable  Behavior Characteristics Cooperative  Mood Pleasant  Thought Process  Coherency WDL  Content WDL  Delusions None reported or observed  Perception WDL  Hallucination None reported or observed  Judgment Poor  Confusion None  Danger to Self  Current suicidal ideation? Denies  Danger to Others  Danger to Others None reported or observed

## 2019-12-30 NOTE — Progress Notes (Signed)
[facility] MD Progress Note  12/30/2019 2:14 PM Nathaniel Holland  MRN:  [id number] Subjective: Patient continues to report feeling better than he did on admission.  He states his mood is better today.  Currently denies suicidal ideations and presents future oriented, hopeful for discharge soon. Objective : I have discussed case with treatment team and have met with patient. 29 year old male.  Presented to ED via EMS under IVC generated by police.  Report is that patient had been hearing knocking sound on his window and contact the police.  When police arrived they found him huddled in the bathroom and noted he was hallucinating, stating that his neighbors home was on fire and that somebody was breaking into their police car as well.  Patient currently denies having recent hallucinatory experiences, denies recent depression or neurovegetative symptoms and states he has generally been doing well.  He reports past history of cannabis abuse but states he has been using less frequently.  Of note, patient was admitted to [place] in November 2020 for disorganized behaviors, psychosis, which at that time was felt to be substance-induced.  He was discharged on risperidone 3 mg twice daily. Patient states that he has continued to take risperidone since then although compliance has been limited since he reports he does not have an established provider and has been getting the medication from ED visits.  He states he had stopped the risperidone for about 3 weeks up to a few days ago, because he was "running out".    At present he is alert, attentive, polite on approach. No disruptive or agitated behaviors on unit. She denies hallucinations and does not appear internally preoccupied at this time.  No overt delusions are expressed currently.  He is focused on seeing his family/children soon. Currently denies suicidal or self-injurious ideations and is hoping for discharge soon. He is currently on risperidone 1 mg every morning and  2 mg nightly.  He was also started on Zoloft 25 mg daily yesterday for reports of depression.  Has tolerated well thus far, does not endorse side effects. Some group participation, no disruptive or agitated behaviors on unit.  Principal Problem: Psychosis, consider Substance Induced  Diagnosis: Active Problems:   Substance-induced psychotic disorder ([place])  Total Time spent with patient: 20 minutes  Past Psychiatric History:   Past Medical History:  Past Medical History:  Diagnosis Date  . Bipolar 1 disorder ([place])    History reviewed. No pertinent surgical history. Family History: History reviewed. No pertinent family history. Family Psychiatric  History:  Social History:  Social History   Substance and Sexual Activity  Alcohol Use Yes  . Alcohol/week: 2.0 - 3.0 standard drinks  . Types: 2 - 3 Standard drinks or equivalent per week   Comment: "depends on how I feel and how much cannabis I have"     Social History   Substance and Sexual Activity  Drug Use Yes  . Types: Marijuana    Social History   Socioeconomic History  . Marital status: Single    Spouse name: Not on file  . Number of children: Not on file  . Years of education: Not on file  . Highest education level: Not on file  Occupational History  . Not on file  Tobacco Use  . Smoking status: Never Smoker  . Smokeless tobacco: Never Used  Vaping Use  . Vaping Use: Never used  Substance and Sexual Activity  . Alcohol use: Yes    Alcohol/week: 2.0 - 3.0 standard  drinks    Types: 2 - 3 Standard drinks or equivalent per week    Comment: "depends on how I feel and how much cannabis I have"  . Drug use: Yes    Types: Marijuana  . Sexual activity: Yes  Other Topics Concern  . Not on file  Social History Narrative  . Not on file   Social Determinants of Health   Financial Resource Strain:   . Difficulty of Paying Living Expenses:   Food Insecurity:   . Worried About Charity fundraiser in the Last Year:    . Arboriculturist in the Last Year:   Transportation Needs:   . Film/video editor (Medical):   Marland Kitchen Lack of Transportation (Non-Medical):   Physical Activity:   . Days of Exercise per Week:   . Minutes of Exercise per Session:   Stress:   . Feeling of Stress :   Social Connections:   . Frequency of Communication with Friends and Family:   . Frequency of Social Gatherings with Friends and Family:   . Attends Religious Services:   . Active Member of Clubs or Organizations:   . Attends Archivist Meetings:   Marland Kitchen Marital Status:    Additional Social History:   Sleep: improving   Appetite:  improving  Current Medications: Current Facility-Administered Medications  Medication Dose Route Frequency Provider Last Rate Last Admin  . acetaminophen (TYLENOL) tablet 650 mg  650 mg Oral Q6H PRN Rozetta Nunnery, NP      . alum & mag hydroxide-simeth (MAALOX/MYLANTA) 200-200-20 MG/5ML suspension 30 mL  30 mL Oral Q4H PRN Lindon Romp A, NP      . benztropine (COGENTIN) tablet 0.5 mg  0.5 mg Oral BID PRN Yoon Barca, Myer Peer, MD      . omega-3 acid ethyl esters (LOVAZA) capsule 1 g  1 g Oral BID Lindon Romp A, NP   1 g at 12/30/19 0916  . risperiDONE (RISPERDAL M-TABS) disintegrating tablet 2 mg  2 mg Oral Q8H PRN Alexandrea Westergard, Myer Peer, MD   2 mg at 12/30/19 1045   And  . ziprasidone (GEODON) injection 10 mg  10 mg Intramuscular PRN Magaline Steinberg, Myer Peer, MD      . risperiDONE (RISPERDAL) tablet 1 mg  1 mg Oral BH-q7a Simrat Kendrick, Myer Peer, MD   1 mg at 12/30/19 0716  . risperiDONE (RISPERDAL) tablet 2 mg  2 mg Oral QHS Geniya Fulgham, Myer Peer, MD   2 mg at 12/29/19 2047  . sertraline (ZOLOFT) tablet 25 mg  25 mg Oral Daily Adelai Achey, Myer Peer, MD   25 mg at 12/30/19 0916  . traZODone (DESYREL) tablet 50 mg  50 mg Oral QHS PRN Jodeci Roarty, Myer Peer, MD   50 mg at 12/29/19 2047    Lab Results:  No results found for this or any previous visit (from the past 48 hour(s)).  Blood Alcohol level:  Lab Results   Component Value Date   ETH <10 12/26/2019   ETH 133 (H) 73/41/9379    Metabolic Disorder Labs: Lab Results  Component Value Date   HGBA1C 5.4 12/28/2019   MPG 108.28 12/28/2019   MPG 105.41 04/08/2019   Lab Results  Component Value Date   PROLACTIN 16.9 (H) 04/08/2019   Lab Results  Component Value Date   CHOL 158 12/28/2019   TRIG 65 12/28/2019   HDL 56 12/28/2019   CHOLHDL 2.8 12/28/2019   VLDL 13 12/28/2019   LDLCALC 89 12/28/2019  [place] 86 04/08/2019    Physical Findings: AIMS: Facial and Oral Movements Muscles of Facial Expression: None, normal Lips and Perioral Area: None, normal Jaw: None, normal Tongue: None, normal,Extremity Movements Upper (arms, wrists, hands, fingers): None, normal Lower (legs, knees, ankles, toes): None, normal, Trunk Movements Neck, shoulders, hips: None, normal, Overall Severity Severity of abnormal movements (highest score from questions above): None, normal Incapacitation due to abnormal movements: None, normal Patient's awareness of abnormal movements (rate only patient's report): No Awareness, Dental Status Current problems with teeth and/or dentures?: No Does patient usually wear dentures?: No  CIWA:    COWS:     Musculoskeletal: Strength & Muscle Tone: within normal limits Gait & Station: normal Patient leans: N/A  Psychiatric Specialty Exam: Physical Exam  Review of Systems denies headache, denies chest pain, no shortness of breath, no vomiting   Blood pressure 112/79, pulse 96, temperature 98.1 F (36.7 C), temperature source Oral, resp. rate 18, height _0  (1.676 m), weight 64 kg, SpO2 97 %.Body mass index is 22.76 kg/m.  General Appearance: Well Groomed  Eye Contact:  Good  Speech:  Normal Rate  Volume:  Normal  Mood:  States mood "better today"  Affect:  More reactive, not tearful today  Thought Process:  Linear and better organized  Orientation:  Other:  fully alert and attentive  Thought Content:   Denies hallucinations, no delusions are expressed at the present time, does not appear internally preoccupied  Suicidal Thoughts:  No denies suicidal or self injurious ideations, denies HI   Homicidal Thoughts:  No  Memory:  Recent and remote grossly intact  Judgement: Fair/improving   Insight:  Fair/ improving   Psychomotor Activity:  no psychomotor agitation or restlessness -Currently presents calm  Concentration:  Concentration: Improving and Attention Span: Improving  Recall:  Good  Fund of Knowledge:  Good  Language:  Good  Akathisia:  Negative  Handed:  Right  AIMS (if indicated):     Assets:  Communication Skills Desire for Improvement Resilience  ADL's:  Intact  Cognition:  WNL  Sleep:  Number of Hours: 8.25   Assessment -  29 year old male.  Presented to ED via EMS under IVC generated by police.  Report is that patient had been hearing knocking sound on his window and contact the police.  When police arrived they found him huddled in the bathroom and noted he was hallucinating, stating that his neighbors home was on fire and that somebody was breaking into their police car as well.  Patient currently denies having recent hallucinatory experiences, denies recent depression or neurovegetative symptoms and states he has generally been doing well.  He reports past history of cannabis abuse but states he has been using less frequently.  Of note, patient was admitted to [place] in November 2020 for disorganized behaviors, psychosis, which at that time was felt to be substance-induced.  He was discharged on risperidone 3 mg twice daily. Patient states that he has continued to take risperidone since then although compliance has been limited since he reports he does not have an established provider and has been getting the medication from ED visits.  He states he had stopped the risperidone for about 3 weeks up to a few days ago, because he was "running out".   Today patient reports improving  mood, states he is feeling better.  Currently not presenting with agitation or overt psychotic symptoms.  Denies suicidal ideations and currently presents future oriented.  Tolerating Risperidone and Zoloft (which  was added yesterday for reports of depression) well thus far.   Treatment Plan Summary: Daily contact with patient to assess and evaluate symptoms and progress in treatment and Medication management Treatment Plan reviewed as below today 7/28 Encourage group/milieu participation Encourage sobriety, abstinence Treatment team working on disposition planning options Continue Risperidone 1 mgr QAM and 2 mgrs QHS for psychosis Continue Zoloft 25 mgrs QDAY initially for depression Continue Trazodone 50 mgrs QHS PRN for insomnia Continue Cogentin 0.5 mgrs BID PRN for EPS if needed Continue Vistaril 25 mgrs Q 8 hours PRN for anxiety as needed Continue Agitation protocol for acute agitation as needed   Jenne Campus, MD 12/30/2019, 2:14 PM   Patient ID: Nathaniel Holland, male   DOB: December 05, 1990, 29 y.o.   MRN: [id number]

## 2019-12-30 NOTE — Progress Notes (Signed)
   12/30/19 2050  Psych Admission Type (Psych Patients Only)  Admission Status Involuntary  Psychosocial Assessment  Patient Complaints Anxiety  Eye Contact Fair  Facial Expression Animated  Affect Anxious  Speech Logical/coherent  Interaction Assertive  Motor Activity Other (Comment)  Appearance/Hygiene Unremarkable  Behavior Characteristics Cooperative  Thought Process  Coherency WDL  Content WDL  Delusions None reported or observed  Perception WDL  Hallucination None reported or observed  Judgment Poor  Confusion None  Danger to Self  Current suicidal ideation? Denies  Danger to Others  Danger to Others None reported or observed

## 2019-12-30 NOTE — BHH Group Notes (Signed)
The focus of this group is to help patients establish daily goals to achieve during treatment and discuss how the patient can incorporate goal setting into their daily lives to aide in recovery.  Pt did not attend group 

## 2019-12-31 MED ORDER — BENZTROPINE MESYLATE 0.5 MG PO TABS: 0.5000 mg | ORAL_TABLET | Freq: Two times a day (BID) | ORAL | 0 refills | Status: DC | PRN

## 2019-12-31 MED ORDER — RISPERIDONE 1 MG PO TABS: 1.0000 mg | ORAL_TABLET | ORAL | 0 refills | Status: DC

## 2019-12-31 MED ORDER — TRAZODONE HCL 50 MG PO TABS: 50.0000 mg | ORAL_TABLET | Freq: Every evening | ORAL | 0 refills | Status: DC | PRN

## 2019-12-31 MED ORDER — SERTRALINE HCL 25 MG PO TABS: 25.0000 mg | ORAL_TABLET | Freq: Every day | ORAL | 0 refills | Status: DC

## 2019-12-31 MED ORDER — OMEGA-3-ACID ETHYL ESTERS 1 G PO CAPS: 1.0000 g | ORAL_CAPSULE | Freq: Two times a day (BID) | ORAL | 0 refills | Status: AC

## 2019-12-31 MED ORDER — RISPERIDONE 2 MG PO TABS: 2.0000 mg | ORAL_TABLET | Freq: Every day | ORAL | 0 refills | Status: DC

## 2019-12-31 NOTE — Progress Notes (Signed)
Recreation Therapy Notes  INPATIENT RECREATION TR PLAN  Patient Details Name: Nathaniel Holland MRN: [id number] DOB: 11-04-1990 Today's Date: 12/31/2019  Rec Therapy Plan Is patient appropriate for Therapeutic Recreation?: Yes Treatment times per week: about 3 days Estimated Length of Stay: 5-7 days TR Treatment/Interventions: Group participation (Comment)  Discharge Criteria Pt will be discharged from therapy if:: Discharged Treatment plan/goals/alternatives discussed and agreed upon by:: Patient/family  Discharge Summary Short term goals set: See patient care plan Short term goals met: Complete Progress toward goals comments: Groups attended Which groups?: Wellness, Self-esteem, Other (Comment) (Team building) Reason goals not met: None Therapeutic equipment acquired: N/A Reason patient discharged from therapy: Discharge from hospital Pt/family agrees with progress & goals achieved: Yes Date patient discharged from therapy: 12/31/19    Victorino Sparrow, LRT/CTRS  [name], [place] 12/31/2019, 12:09 PM

## 2019-12-31 NOTE — Plan of Care (Signed)
Pt engaged in groups without prompting at completion of recreation therapy group sessions.    [name], LRT/CTRS

## 2019-12-31 NOTE — Progress Notes (Signed)
Recreation Therapy Notes  Date: 7.29.21 Time: 1000 Location: 500 Hall Dayroom  Group Topic: Self-Esteem  Goal Area(s) Addresses:  Patient will successfully identify positive attributes about themselves.  Patient will successfully identify benefit of improved self-esteem.   Behavioral Response: Engaged  Intervention: Blank crest, markers, colored pencils  Activity: Crest of Arms.  Patients were given a blank crest divided into four parts.  Patients were to highlight something unique about themselves in each of the sections.  Education:  Self-Esteem, Building control surveyor.   Education Outcome: Acknowledges education/In group clarification offered/Needs additional education  Clinical Observations/Feedback: Pt showed he is loving.  Loyalty, honor and respect are important to him.  Morals, principles and everything else mentioned makes him the person he is.  Pt expressed everything influences how we see ourselves in some way.  Pt also felt his negatives overshadow his positives because people tend to focus on that more.     [name], LRT/CTRS    [name] A 12/31/2019 12:02 PM

## 2019-12-31 NOTE — Progress Notes (Signed)
Pt discharged to lobby. Pt was stable and appreciative at that time. All papers and prescriptions were given and valuables returned. Verbal understanding expressed. Denies SI/HI and A/VH. Pt given opportunity to express concerns and ask questions.  

## 2019-12-31 NOTE — BHH Suicide Risk Assessment (Signed)
[facility] Discharge Suicide Risk Assessment   Principal Problem: psychosis Discharge Diagnoses: Active Problems:   Substance-induced psychotic disorder ([facility])   Total Time spent with patient: 30 minutes  Musculoskeletal: Strength & Muscle Tone: within normal limits Gait & Station: normal Patient leans: N/A  Psychiatric Specialty Exam: Review of Systems no headache, no chest pain, no shortness of breath, no vomiting   Blood pressure 100/78, pulse (!) 109, temperature 97.9 F (36.6 C), temperature source Oral, resp. rate 18, height 5\' 6"  (1.676 m), weight 64 kg, SpO2 97 %.Body mass index is 22.76 kg/m.  General Appearance: Well Groomed  Eye Contact::  Good  Speech:  Normal Rate409  Volume:  Normal  Mood:  reports mood is "OK", states mood has improved compared to admission  Affect:  more reactive, not tearful today  Thought Process:  Linear and Descriptions of Associations: Intact  Orientation:  Full (Time, Place, and Person)  Thought Content:  currently denies hallucinations, no delusions  Suicidal Thoughts:  No denies suicidal or self injurious ideations, denies homicidal or violent ideations  Homicidal Thoughts:  No  Memory:  recent and remote grossly intact   Judgement:  Other:  improving   Insight:  fair- improving   Psychomotor Activity:  Normal- no psychomotor agitation or restlessness   Concentration:  Good  Recall:  Good  Fund of Knowledge:Good  Language: Good  Akathisia:  Negative  Handed:  Right  AIMS (if indicated):     Assets:  Communication Skills Desire for Improvement Resilience  Sleep:  Number of Hours: 6.25  Cognition: WNL  ADL's:  Intact   Mental Status Per Nursing Assessment::   On Admission:  NA  Demographic Factors:  36 y old male, single, lives with mother , employed   Loss Factors: Cannabis Use Disorder history   Historical Factors: Past psychiatric admissions November 2020.  Risk Reduction Factors:   Sense of responsibility to family and  Positive coping skills or problem solving skills  Continued Clinical Symptoms:  At this time patient is alert, attentive, oriented x 3 , well groomed , good eye contact, speech normal. Reports he is feeling better and describes improving mood. Affect is appropriate, reactive. Denies suicidal or self injurious ideations, denies homicidal or violent ideations, no hallucinations , no delusions expressed at this time. Not internally preoccupied . Behavior on unit calm and in good control, visible in day room/milieu. Tolerating medications well, denies side effects.Side effects reviewed, including risk of sedation, metabolic and motor disorders associated with Risperidone. We discussed likely benefit of abstinence from drugs/cannabis .  Cognitive Features That Contribute To Risk:  No gross cognitive deficits noted upon discharge. Is alert , attentive, and oriented x 3   Suicide Risk:  Mild:  Suicidal ideation of limited frequency, intensity, duration, and specificity.  There are no identifiable plans, no associated intent, mild dysphoria and related symptoms, good self-control (both objective and subjective assessment), few other risk factors, and identifiable protective factors, including available and accessible social support.   Follow-up Information    [facility] Follow up on 01/11/2020.   Specialty: Behavioral Health Why: You have an appointment for therapy on 01/11/20 at 4:00 pm. You also have an appointment for medication management on 02/02/20 at 2:00 pm. These appointments will be held Virtually through MyChart. Contact information: [address] [place] [phone]              Plan Of Care/Follow-up recommendations:  Activity:  as tolerasted Diet:  regular Tests:  NA Other:  See below  Patient is expressing readiness for discharge and is leaving unit in good spirits . Plans to return home, states his mother will pick him up  later today Follow up as above   Craige Cotta, MD 12/31/2019, 11:10 AM

## 2019-12-31 NOTE — Discharge Summary (Addendum)
Physician Discharge Summary Note  Patient:  Nathaniel Holland is an 29 y.o., male  MRN:  [id number]  DOB:  12-13-1990  Patient phone:  [phone] (home)   Patient address:   [address] [place] [address],   Total Time spent with patient:  Greater than 30 minutes  Date of Admission:  12/27/2019  Date of Discharge: 12-31-19  Reason for Admission: Worsening hallucinations & delusional thinking.   Principal Problem: Psychotic disorder [facility])  Discharge Diagnoses: Principal Problem:   Psychotic disorder (HCC) Active Problems:   Substance induced mood disorder (HCC)   Substance-induced psychotic disorder (HCC)  Past Psychiatric History: Psychotic disorder.  Past Medical History:  Past Medical History:  Diagnosis Date   Bipolar 1 disorder (HCC)    History reviewed. No pertinent surgical history.  Family History: History reviewed. No pertinent family history.  Family Psychiatric  History: See H&P  Social History:  Social History   Substance and Sexual Activity  Alcohol Use Yes   Alcohol/week: 2.0 - 3.0 standard drinks   Types: 2 - 3 Standard drinks or equivalent per week   Comment: "depends on how I feel and how much cannabis I have"     Social History   Substance and Sexual Activity  Drug Use Yes   Types: Marijuana    Social History   Socioeconomic History   Marital status: Single    Spouse name: Not on file   Number of children: Not on file   Years of education: Not on file   Highest education level: Not on file  Occupational History   Not on file  Tobacco Use   Smoking status: Never Smoker   Smokeless tobacco: Never Used  Vaping Use   Vaping Use: Never used  Substance and Sexual Activity   Alcohol use: Yes    Alcohol/week: 2.0 - 3.0 standard drinks    Types: 2 - 3 Standard drinks or equivalent per week    Comment: "depends on how I feel and how much cannabis I have"   Drug use: Yes    Types: Marijuana   Sexual activity: Yes  Other  Topics Concern   Not on file  Social History Narrative   Not on file   Social Determinants of Health   Financial Resource Strain:    Difficulty of Paying Living Expenses:   Food Insecurity:    Worried About Programme researcher, broadcasting/film/video in the Last Year:    Barista in the Last Year:   Transportation Needs:    Freight forwarder (Medical):    Lack of Transportation (Non-Medical):   Physical Activity:    Days of Exercise per Week:    Minutes of Exercise per Session:   Stress:    Feeling of Stress :   Social Connections:    Frequency of Communication with Friends and Family:    Frequency of Social Gatherings with Friends and Family:    Attends Religious Services:    Active Member of Clubs or Organizations:    Attends Banker Meetings:    Marital Status:    Hospital Course: (Per Md's admission evaluation notes): 59 y old male, presented to ED via EMS. Under IVC generated per [employer]. Patient reports that there had been somebody knocking on his window on several occasions the night before last. He states " I called the police ". As per chart notes, [employer] responded to a breaking and entering and upon entering  they found patient huddled in  the bathroom . They noted he was hallucinating, stating that his neighbor's home was on fire and saying that someone was breaking into the police car. Currently patient denies . States " I did say there appeared to be smoke but it ended up being a tree". He reports past history of cannabis abuse, but states he does not use this substance often . Admission UDS positive for cannabis. BAL negative. He denies any recent depression, states " my mood has been OK", and does not endorse significant neuro-vegetative symptoms. Denies having suicidal ideations. Of note, patient reports he is being prescribed Risperidone (dosed at 3 mgr BID) and reports irregular use as he gets during ED visits/does not have a regular prescriber. States he had stopped three  weeks ago because he was about to run out , and restarted medication a few days ago. Denies medication side effects.     [facility] this 29 year old Hispanic male with hx of chronic mental illness, alcohol/cocaine use use disorders & multiple psychiatric admissions. [facility] of [facility] mental [facility]. He has been on psychotropic medications for his symptoms & it appeared his symptoms has not been able to improve because Nathaniel Holland is non-compliant to his treatment regimen. [facility] & treatment for worsening psychosis/hallucinations.  After the above admission evaluation this time around, Nathaniel Holland presenting symptoms were identified. He was again recommended for mood stabilization treatments. The medication regimen for his presenting symptoms were discussed & with his consent initiated. He received, stabilized & was discharged on the medications as listed below on his discharge medication lists below. He was also enrolled & participated in the group counseling sessions being offered & held on this unit. He learned coping skills. Nathaniel Holland presented on this admission, no other medical conditions that required treatment & monitoring other than vitamin supplemet. He was resumed/discharged on all his pertinent home medications for those health issues. He tolerated his treatment regimen without any adverse effects or reactions reported.  During the course of his hospitalization, the 15-minute checks were adequate to ensure Nathaniel Holland's safety.  Patient did not display any dangerous, violent or suicidal behavior on the unit.  He interacted with patients & staff appropriately. He participated appropriately in the group sessions/therapies. His medications were addressed & adjusted to meet his needs. He was recommended for outpatient follow-up care & medication management upon discharge to assure  his continuity of care.  At the time of discharge patient is not reporting any acute suicidal/homicidal ideations. He currently denies any new issues or concerns. Education and supportive counseling provided throughout his hospital stay & upon discharge.  Today upon his discharge evaluation with the attending psychiatrist, Nathaniel Holland shares he is doing well. He denies any other specific concerns. He is sleeping well. His appetite is good. he denies other physical complaints. He denies AH/VH. He feels that his medications have been helpful & is in agreement to continue her current treatment regimen as recommended. He was able to engage in safety planning including [facility] or contact emergency services if he feels unable to maintain his own safety or the safety of others. Pt had no further questions, comments, or concerns. [facility] in no apparent distress. Transportation per mother.  Physical Findings: AIMS: Facial and Oral Movements Muscles of Facial Expression: None, normal Lips and Perioral Area: None,  normal Jaw: None, normal Tongue: None, normal,Extremity Movements Upper (arms, wrists, hands, fingers): None, normal Lower (legs, knees, ankles, toes): None, normal, Trunk Movements Neck, shoulders, hips: None, normal, Overall Severity Severity of abnormal movements (highest score from questions above): None, normal Incapacitation due to abnormal movements: None, normal Patient's awareness of abnormal movements (rate only patient's report): No Awareness, Dental Status Current problems with teeth and/or dentures?: No Does patient usually wear dentures?: No  CIWA:    COWS:     Musculoskeletal: Strength & Muscle Tone: within normal limits Gait & Station: normal Patient leans: N/A  Psychiatric Specialty Exam: Physical Exam Vitals and nursing note reviewed.  Constitutional:      Appearance: He is well-developed.  HENT:     Nose: Nose normal.      Mouth/Throat:     Pharynx: Oropharynx is clear.  Eyes:     Pupils: Pupils are equal, round, and reactive to light.  Cardiovascular:     Rate and Rhythm: Normal rate and regular rhythm.     Pulses: Normal pulses.  Pulmonary:     Effort: Pulmonary effort is normal. No respiratory distress.     Breath sounds: No wheezing.  Abdominal:     Palpations: Abdomen is soft.  Genitourinary:    Comments: Deferred Musculoskeletal:        General: Normal range of motion.     Cervical back: Normal range of motion.  Skin:    General: Skin is warm and dry.  Neurological:     Mental Status: He is alert and oriented to person, place, and time.     Review of Systems  Constitutional: Negative for chills and fever.  HENT: Negative.  Negative for congestion and sore throat.   Eyes: Negative.   Respiratory: Negative for cough, shortness of breath and wheezing.   Cardiovascular: Negative for chest pain and palpitations.  Gastrointestinal: Negative for abdominal pain, diarrhea, heartburn, nausea and vomiting.  Genitourinary: Negative.   Musculoskeletal: Negative.   Skin: Negative.   Neurological: Negative for dizziness and headaches.  Endo/Heme/Allergies: Negative.   Psychiatric/Behavioral: Positive for depression (Stabilized with medication prior to discharge), hallucinations (Hx. Psychosis (stabilized with medication prior to discharge)) and substance abuse (Hx. alcohol & THC use disorders). Negative for memory loss and suicidal ideas. The patient has insomnia (Stabilized with medication prior to discharge). The patient is not nervous/anxious (Stable).     Blood pressure 100/78, pulse (!) 109, temperature 97.9 F (36.6 C), temperature source Oral, resp. rate 18, height 5\' 6"  (1.676 m), weight 64 kg, SpO2 97 %.Body mass index is 22.76 kg/m.  See Md's discharge SRA   Has this patient used any form of tobacco in the last 30 days? (Cigarettes, Smokeless Tobacco, Cigars, and/or Pipes): N/A  Blood  Alcohol level:  Lab Results  Component Value Date   ETH <10 12/26/2019   ETH 133 (H) 04/04/2019   Metabolic Disorder Labs:  Lab Results  Component Value Date   HGBA1C 5.4 12/28/2019   MPG 108.28 12/28/2019   MPG 105.41 04/08/2019   Lab Results  Component Value Date   PROLACTIN 16.9 (H) 04/08/2019   Lab Results  Component Value Date   CHOL 158 12/28/2019   TRIG 65 12/28/2019   HDL 56 12/28/2019   CHOLHDL 2.8 12/28/2019   VLDL 13 12/28/2019   LDLCALC 89 12/28/2019   LDLCALC 86 04/08/2019   See Psychiatric Specialty Exam and Suicide Risk Assessment completed by Attending Physician prior to discharge.  Discharge destination:  Home  Is patient on multiple antipsychotic therapies at discharge:  No   Has Patient had three or more failed trials of antipsychotic monotherapy by history:  No  Recommended Plan for Multiple Antipsychotic Therapies: NA   Allergies as of 12/31/2019   No Known Allergies      Medication List     STOP taking these medications    cloNIDine 0.1 MG tablet Commonly known as: CATAPRES   hydrOXYzine 25 MG tablet Commonly known as: ATARAX/VISTARIL       TAKE these medications      Indication  benztropine 0.5 MG tablet Commonly known as: COGENTIN Take 1 tablet (0.5 mg total) by mouth 2 (two) times daily as needed for tremors (EPS). What changed:  when to take this reasons to take this additional instructions  Indication: Extrapyramidal Reaction caused by Medications   omega-3 acid ethyl esters 1 g capsule Commonly known as: LOVAZA Take 1 capsule (1 g total) by mouth 2 (two) times daily. Vitamin supplement  Indication: Vitamin supplement   risperiDONE 2 MG tablet Commonly known as: RISPERDAL Take 1 tablet (2 mg total) by mouth at bedtime. For mood control What changed: You were already taking a medication with the same name, and this prescription was added. Make sure you understand how and when to take each.  Indication: Mood  control   risperiDONE 1 MG tablet Commonly known as: RISPERDAL Take 1 tablet (1 mg total) by mouth every morning. For mood control Start taking on: January 01, 2020 What changed:  medication strength how much to take when to take this  Indication: Mood control   sertraline 25 MG tablet Commonly known as: ZOLOFT Take 1 tablet (25 mg total) by mouth daily. For depression Start taking on: January 01, 2020  Indication: Major Depressive Disorder   traZODone 50 MG tablet Commonly known as: DESYREL Take 1 tablet (50 mg total) by mouth at bedtime as needed for sleep. What changed:  medication strength how much to take  Indication: Trouble Sleeping, insomnia        Follow-up Information     [facility] Follow up on 01/11/2020.   Specialty: Behavioral Health Why: You have an appointment for therapy on 01/11/20 at 4:00 pm. You also have an appointment for medication management on 02/02/20 at 2:00 pm. These appointments will be held Virtually through MyChart. Contact information: [address] [place] [address] [phone]               Follow-up recommendations: Activity:  As tolerated Diet: As recommended by your primary care doctor. Keep all scheduled follow-up appointments as recommended.   Comments: Prescriptions given at discharge.  Patient agreeable to plan.  Given opportunity to ask questions.  Appears to feel comfortable with discharge denies any current suicidal or homicidal thought. Patient is also instructed prior to discharge to: Take all medications as prescribed by his/her mental healthcare provider. Report any adverse effects and or reactions from the medicines to his/her outpatient provider promptly. Patient has been instructed & cautioned: To not engage in alcohol and or illegal drug use while on prescription medicines. In the event of worsening symptoms, patient is instructed to call the crisis hotline, 911 and or go to the  nearest ED for appropriate evaluation and treatment of symptoms. To follow-up with his/her primary care provider for your other medical issues, concerns and or health care needs.   Signed: Armandina Stammer, NP, PMHNP, FNP-BC 12/31/2019, 3:29 PM  Patient seen, Suicide Assessment Completed.  Disposition Plan Reviewed

## 2019-12-31 NOTE — Progress Notes (Signed)
  [facility] Adult Case Management Discharge Plan :  Will you be returning to the same living situation after discharge:  Yes,  to home. At discharge, do you have transportation home?: Yes,  mother to pick up. Do you have the ability to pay for your medications: Yes,  has insurance.   Release of information consent forms completed and in the chart;  Patient's signature needed at discharge.  Patient to Follow up at:  Follow-up Information    [facility] Follow up on 01/11/2020.   Specialty: Behavioral Health Why: You have an appointment for therapy on 01/11/20 at 4:00 pm. You also have an appointment for medication management on 02/02/20 at 2:00 pm. These appointments will be held Virtually through MyChart. Contact information: [address] [place] [address] [phone]              Next level of care provider has access to [facility] Link:yes  Safety Planning and Suicide Prevention discussed: Yes,  with mother.     Has patient been referred to the Quitline?: Patient refused referral  Patient has been referred for addiction treatment: Pt. refused referral  Otelia Santee, LCSW 12/31/2019, 10:09 AM

## 2020-01-11 ENCOUNTER — Other Ambulatory Visit: Payer: Self-pay

## 2020-01-11 ENCOUNTER — Ambulatory Visit (INDEPENDENT_AMBULATORY_CARE_PROVIDER_SITE_OTHER): Payer: Medicaid Other | Admitting: Licensed Clinical Social Worker

## 2020-01-11 DIAGNOSIS — F319 Bipolar disorder, unspecified: Secondary | ICD-10-CM
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
## 2020-02-01 MED FILL — Trazodone HCl Tab 50 MG: ORAL | Qty: 50 | Status: AC

## 2020-02-02 ENCOUNTER — Other Ambulatory Visit: Payer: Self-pay

## 2020-02-02 ENCOUNTER — Telehealth (HOSPITAL_COMMUNITY): Payer: Medicaid Other | Admitting: Psychiatry

## 2020-02-24 ENCOUNTER — Other Ambulatory Visit: Payer: Self-pay

## 2020-02-24 ENCOUNTER — Ambulatory Visit (INDEPENDENT_AMBULATORY_CARE_PROVIDER_SITE_OTHER): Payer: Medicaid Other | Admitting: Licensed Clinical Social Worker

## 2020-02-24 DIAGNOSIS — F3162 Bipolar disorder, current episode mixed, moderate: Secondary | ICD-10-CM

## 2020-02-24 DIAGNOSIS — F209 Schizophrenia, unspecified: Secondary | ICD-10-CM

## 2020-02-24 NOTE — Progress Notes (Signed)
   THERAPIST PROGRESS NOTE  Session Time: 14   Therapist Response:    Subjective/objective: Pt was alert and oriented x 5. He was dressed casually but bizarre as evidence by wearing sunglasses indoors. Pt presented today with depressed and tearful mood/affect.   Pt reports that his primary stressor has been work and finical. 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 This is due to the fact he did not have a social security card. Pt has now obtained social security card and has applied to the [employer] store walking distance from his mother's houses where he stays.   Currently pt denied drug use other than the occasional "Joint" every few weeks. Originally pt denied any hallucinations visual or auditory. Towards the end of session pt does states he hears his own voice in his head but does not know how frequent these voices. LCSW asked pt to write down every time he hears the voices. Per [facility] H[facility]P and discharge summary pt is being seen for psychosis disorder and substance abuse. Pt has continually denied substance abuse on a regular basis.   He did come in explaining that he wears sunglasses to hide his random tears stating, "sometimes I just randomly have tears come down my face". Pt explained that this is due to "fake love" he thinks people have for him. LCSW asked for further explanation. Pt reports that he feels that people that love him are not genuine, and he does feel some paranoia due to this.    Assessment/plan: Pt endorses symptoms for depression and anxiety for worry, tension, sadness, tearfulness, and insomnia. He also reports rapid thoughts, irritability, and some increase in energy. Currently pt meets criteria for schizophrenia unspecified and bipolar 1 disorder mixed. Pt reports only taking her his Zoloft and not his risperidone.    Participation Level: Active  Behavioral Response: Bizarre, Casual and Fairly GroomedAlertAnxious and Depressed  Type  of Therapy: Individual Therapy  Treatment Goals addressed: Diagnosis: Bipolar mixed   Interventions: Solution Focused and Supportive  Summary: Nathaniel Holland is a 29 y.o. male who presents with bipolar disorder order mixed.   Suicidal/Homicidal: Nowithout intent/plan  Plan: Return again in 2 weeks.      [name], LCSW 02/24/2020

## 2020-03-09 ENCOUNTER — Ambulatory Visit (HOSPITAL_COMMUNITY): Payer: Self-pay | Admitting: Licensed Clinical Social Worker

## 2020-03-22 ENCOUNTER — Telehealth (HOSPITAL_COMMUNITY): Payer: Medicaid Other

## 2020-03-22 ENCOUNTER — Other Ambulatory Visit: Payer: Self-pay

## 2020-03-23 ENCOUNTER — Other Ambulatory Visit: Payer: Self-pay

## 2020-03-23 ENCOUNTER — Ambulatory Visit (INDEPENDENT_AMBULATORY_CARE_PROVIDER_SITE_OTHER): Payer: Medicaid Other | Admitting: Licensed Clinical Social Worker

## 2020-03-23 DIAGNOSIS — F251 Schizoaffective disorder, depressive type: Secondary | ICD-10-CM | POA: Diagnosis not present

## 2020-03-23 DIAGNOSIS — F209 Schizophrenia, unspecified: Secondary | ICD-10-CM

## 2020-03-23 DIAGNOSIS — F3162 Bipolar disorder, current episode mixed, moderate: Secondary | ICD-10-CM

## 2020-03-23 NOTE — Progress Notes (Signed)
   THERAPIST PROGRESS NOTE  Session Time: 70   Therapist Response:    Subjective/Objective:  Pt was alert and oriented x 5. He was dressed casually, groomed well, but bizarrely due to sunglasses inside. Pt still reports that the sunglasses he wears is to cover up his tears. He presented today with depressed, hopeless, worthless, and tearful affect/mood.   Pt reports primary stressor has been his medication mgmt. Pt has not had any refills since he was discharged from [facility] in late July. Pt states he no showed for 1 appt because he did not have transportation and the other appt was supposed to be video but pt states he never got the link. Nathaniel Holland reports that his symptoms have been worsening since he has run out of medications. Medications taken risperidone, sertraline, & trazodone.   Pt reports that he has a Hx with bipolar disorder, schizophrenia, anti-social personality disorder, and PTSD. Pt is currently experiencing increased AH with no SI/HI currently. Pt was contracted for safety. Nathaniel Holland reports his primary drive is to do better. He has not been doing any drugs or alcohol since his discharge from [facility]. He states that he wants to get a job and start to be able to function more in everyday life. He does hear voice in his head daily of people praying for him but they do not tell him to do anything bad. He continues to cry daily    Assessment/Plan: Pt endorse symptoms for psychosis and AH "people that pray for me and I hear them daily", with bizarre behavior such as wearing sunglasses inside. He also has increased depression for sadness, tearfulness, worthlessness, hopelessness, and insomnia. Pt currently meets criteria for schizoaffective disorder depressive type. Currently pt is not being managed with medication since sometime in late august to early September. Plan moving forward is to establish pt on medication and get an appointment that works for him to help with his worsening symptoms.   Participation Level: Active  Behavioral Response: Bizarre, Casual and Fairly GroomedAlertDepressed, Hopeless, Worthless and tearful   Type of Therapy: Individual Therapy  Treatment Goals addressed: Diagnosis: bipolar 1 and schzioaffect disorder depressive type   Interventions: CBT and Supportive  Summary: Nathaniel Holland is a 29 y.o. male who presents with bipolar 1 and schzioaffect disorder depressive type  .   Suicidal/Homicidal: NAwithout intent/plan   Plan: Return again in 2 weeks.      [name], LCSW 03/23/2020

## 2020-03-31 ENCOUNTER — Ambulatory Visit (HOSPITAL_COMMUNITY): Payer: Medicaid Other | Admitting: Physician Assistant

## 2020-04-06 ENCOUNTER — Ambulatory Visit (INDEPENDENT_AMBULATORY_CARE_PROVIDER_SITE_OTHER): Payer: Medicaid Other | Admitting: Licensed Clinical Social Worker

## 2020-04-06 ENCOUNTER — Other Ambulatory Visit: Payer: Self-pay

## 2020-04-06 DIAGNOSIS — F209 Schizophrenia, unspecified: Secondary | ICD-10-CM

## 2020-04-06 DIAGNOSIS — F3130 Bipolar disorder, current episode depressed, mild or moderate severity, unspecified: Secondary | ICD-10-CM | POA: Diagnosis not present

## 2020-04-06 NOTE — Progress Notes (Signed)
   THERAPIST PROGRESS NOTE  Session Time: 15  Therapist Response:    Subjective/Objective:  Pt was alert and oriented x 5. He was dressed casually but bizarre as evidence by sunglasses indoors. He engaged well throughout assessment and was cooperative.   LCSW confronted pt at start of session due to 4th no show or late cancelation for medication management. Nathaniel Holland was agitated with confrontation but responsive. LCSW explained that he can make it to all therapy sessions but not medications appointments. Nathaniel Holland explained that it is due to a lack of transportation and his inability to setup my chart. Pt does not have a cell phone but does have an iPad and his mother cell phone. LCSW used supportive and solution focused therapy and asked pt to get his mother's phone who was sitting in the car. Pt with a tearful affect due to being upset got the phone and brought it upstairs to clinician. Pt and LCSW sat down after having my chart link sent to his mother phone and setup an account. Pt reports reliefs from exercise and stated, "I appreciate you being so upfront with me".    Assessment/Plan: Pt endorse symptoms for paranoia, rapid thoughts, tearfulness, hopelessness, and worthless. Nathaniel Holland has a Hx of reckless behavior due to drug use which he reports that he has not taken drugs in over 2 months. Currently pt meets criteria for bipolar disorder currently depressed with Hx of manic episodes. LCSW is still attempting to rule out schizophrenia as pt does report auditory hallucinations which he states are just his thoughts repeating themselves in his head. Plan moving forward Pt will download my chart app on iPad and practice logging in 1 x weekly. All appointment is switched to virtual as pt reports lack of transportation has been a barrier in the past. Nathaniel Holland was provided the option to come in person if needed. He was agreeable to plan.  Participation Level: Active  Behavioral Response: Bizarre and  CasualAlertAngry, Anxious, Depressed and Worthless  Type of Therapy: Individual Therapy  Treatment Goals addressed: Diagnosis: bipolar disorder   Interventions: Solution Focused and Supportive  Summary: Nathaniel Holland is a 29 y.o. male who presents with bipolar disorder .   Suicidal/Homicidal: Nowithout intent/plan    Plan: Return again in 3 weeks.      [name], LCSW 04/06/2020

## 2020-04-27 ENCOUNTER — Ambulatory Visit (INDEPENDENT_AMBULATORY_CARE_PROVIDER_SITE_OTHER): Payer: Medicaid Other | Admitting: Licensed Clinical Social Worker

## 2020-04-27 ENCOUNTER — Other Ambulatory Visit: Payer: Self-pay

## 2020-04-27 DIAGNOSIS — F3130 Bipolar disorder, current episode depressed, mild or moderate severity, unspecified: Secondary | ICD-10-CM

## 2020-04-27 NOTE — Progress Notes (Signed)
   THERAPIST PROGRESS NOTE  Virtual Visit via Telephone Note  I connected with Bijan Ridgley on 04/27/20 at 10:00 AM EST by telephone and verified that I am speaking with the correct person using two identifiers.  Location: Patient: [facility]  Provider: [facility]    I discussed the limitations, risks, security and privacy concerns of performing an evaluation and management service by telephone and the availability of in person appointments. I also discussed with the patient that there may be a patient responsible charge related to this service. The patient expressed understanding and agreed to proceed.   Subjective/Objective:  Pt was alert and oriented x 5. He was not observed as therapy session was conducted over the phone. Fenris presented today with depressed and tearful mood/affect.   Pt reports primary stressor today as lack of motivation. He is not currently taking any medication since his discharge from [facility]. He has been trying to avoid them he says because he does not want them to change him. LCSW used supportive therapy as primary intervention for today's session. Pt has been stagnant for last 3 session talking about the things he wants to do but does not have the energy to do them. LCSW explained that medication can help aid through the process and if you combine medication management with therapy that has the best chance for success. Pt stated he would be at next appointment for medication management on 11/30 at 1pm he even stated he wrote it down.   Plan for next time is to have pt write down the things he wants to accomplish and rank them in order of priority. LCSW and pt will discuss these goals for next time about the realistic nature of those goals and what are next steps to obtain them.   Assessment/Plan: Pt endorse symptoms for tearfulness, hopelessness, worthlessness, and poor concertation. He has a Hx of manic episodes with AH per [facility] charting. Currently pt does  meet criteria for bipolar 1 disorder currently depressed. Medication management plan is listed above. Pt will f/u with LCSW in 4 weeks. Plan for pt is also list above.    I discussed the assessment and treatment plan with the patient. The patient was provided an opportunity to ask questions and all were answered. The patient agreed with the plan and demonstrated an understanding of the instructions.   The patient was advised to call back or seek an in-person evaluation if the symptoms worsen or if the condition fails to improve as anticipated.  I provided 45  minutes of non-face-to-face time during this encounter.   [name], LCSW  Participation Level: Active  Behavioral Response: NAAlertDepressed and tearful   Type of Therapy: Individual Therapy  Treatment Goals addressed: Diagnosis: bipolar disorder   Interventions: CBT and Supportive  Summary: Laurie Lovejoy is a 29 y.o. male who presents with bipolar 1 disorder.   Suicidal/Homicidal: NAwithout intent/plan    Plan: Return again in 4 weeks.     [name], LCSW 04/27/2020

## 2020-05-03 ENCOUNTER — Telehealth (INDEPENDENT_AMBULATORY_CARE_PROVIDER_SITE_OTHER): Payer: Medicaid Other | Admitting: Physician Assistant

## 2020-05-03 ENCOUNTER — Other Ambulatory Visit: Payer: Self-pay

## 2020-05-03 DIAGNOSIS — F209 Schizophrenia, unspecified: Secondary | ICD-10-CM | POA: Diagnosis not present

## 2020-05-03 DIAGNOSIS — F32A Depression, unspecified: Secondary | ICD-10-CM | POA: Insufficient documentation

## 2020-05-03 DIAGNOSIS — G4700 Insomnia, unspecified: Secondary | ICD-10-CM | POA: Diagnosis not present

## 2020-05-03 DIAGNOSIS — F411 Generalized anxiety disorder: Secondary | ICD-10-CM

## 2020-05-03 DIAGNOSIS — F3130 Bipolar disorder, current episode depressed, mild or moderate severity, unspecified: Secondary | ICD-10-CM

## 2020-05-03 MED ORDER — SERTRALINE HCL 25 MG PO TABS: 25.0000 mg | ORAL_TABLET | Freq: Every day | ORAL | 1 refills | Status: AC

## 2020-05-03 MED ORDER — TRAZODONE HCL 50 MG PO TABS: 50.0000 mg | ORAL_TABLET | Freq: Every evening | ORAL | 1 refills | Status: AC | PRN

## 2020-05-03 MED ORDER — RISPERIDONE 1 MG PO TABS: 1.0000 mg | ORAL_TABLET | ORAL | 1 refills | Status: AC

## 2020-05-03 MED ORDER — RISPERIDONE 2 MG PO TABS: 2.0000 mg | ORAL_TABLET | Freq: Every day | ORAL | 1 refills | Status: AC

## 2020-05-03 MED ORDER — HYDROXYZINE HCL 25 MG PO TABS: 25.0000 mg | ORAL_TABLET | Freq: Three times a day (TID) | ORAL | 1 refills | Status: AC | PRN

## 2020-05-03 MED ORDER — BENZTROPINE MESYLATE 0.5 MG PO TABS: 0.5000 mg | ORAL_TABLET | Freq: Two times a day (BID) | ORAL | 1 refills | Status: AC | PRN
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
## 2020-05-04 MED FILL — BENZTROPINE MES 0.5 MG TAB: 0.5 | 30 days supply | Qty: 60 | Fill #0

## 2020-05-04 MED FILL — risperiDONE 1 MG TABS: 1 | 30 days supply | Qty: 30 | Fill #0

## 2020-05-04 MED FILL — hydrOXYzine HCL 25 MG TABS: 25 | 30 days supply | Qty: 90 | Fill #0

## 2020-05-04 MED FILL — traZODone HCL 50 MG TABS: 50 | 30 days supply | Qty: 30 | Fill #0

## 2020-05-04 MED FILL — SERTRALINE HCL 25 MG TABLET: 25 | 30 days supply | Qty: 30 | Fill #0

## 2020-05-04 MED FILL — risperiDONE 2 MG TABS: 2 | 30 days supply | Qty: 30 | Fill #0

## 2020-05-24 ENCOUNTER — Telehealth (HOSPITAL_COMMUNITY): Payer: Self-pay | Admitting: Licensed Clinical Social Worker

## 2020-05-24 ENCOUNTER — Ambulatory Visit (HOSPITAL_COMMUNITY): Payer: Medicaid Other | Admitting: Licensed Clinical Social Worker

## 2020-05-24 ENCOUNTER — Other Ambulatory Visit: Payer: Self-pay

## 2020-05-24 NOTE — Telephone Encounter (Signed)
LCSW sent two links to number provided in Epic. When pt did not show a call was made to phone that went straight to VM. HIPAA compliant VM left to reschedule.

## 2020-06-08 ENCOUNTER — Other Ambulatory Visit: Payer: Self-pay

## 2020-06-08 ENCOUNTER — Telehealth (HOSPITAL_COMMUNITY): Payer: Medicaid Other | Admitting: Physician Assistant

## 2020-06-30 ENCOUNTER — Ambulatory Visit (HOSPITAL_COMMUNITY): Payer: Medicaid Other | Admitting: Licensed Clinical Social Worker

## 2020-06-30 ENCOUNTER — Encounter (HOSPITAL_COMMUNITY): Payer: Self-pay | Admitting: Physician Assistant

## 2021-10-25 IMAGING — DX DG CHEST 1V PORT
1 series · 1 of 1 positions shown · non-contrast
Comparison: None.

CLINICAL DATA: 27-year-old male with suicidal ideation and chest
pain.

EXAM:
PORTABLE CHEST 1 VIEW

[chest ap]
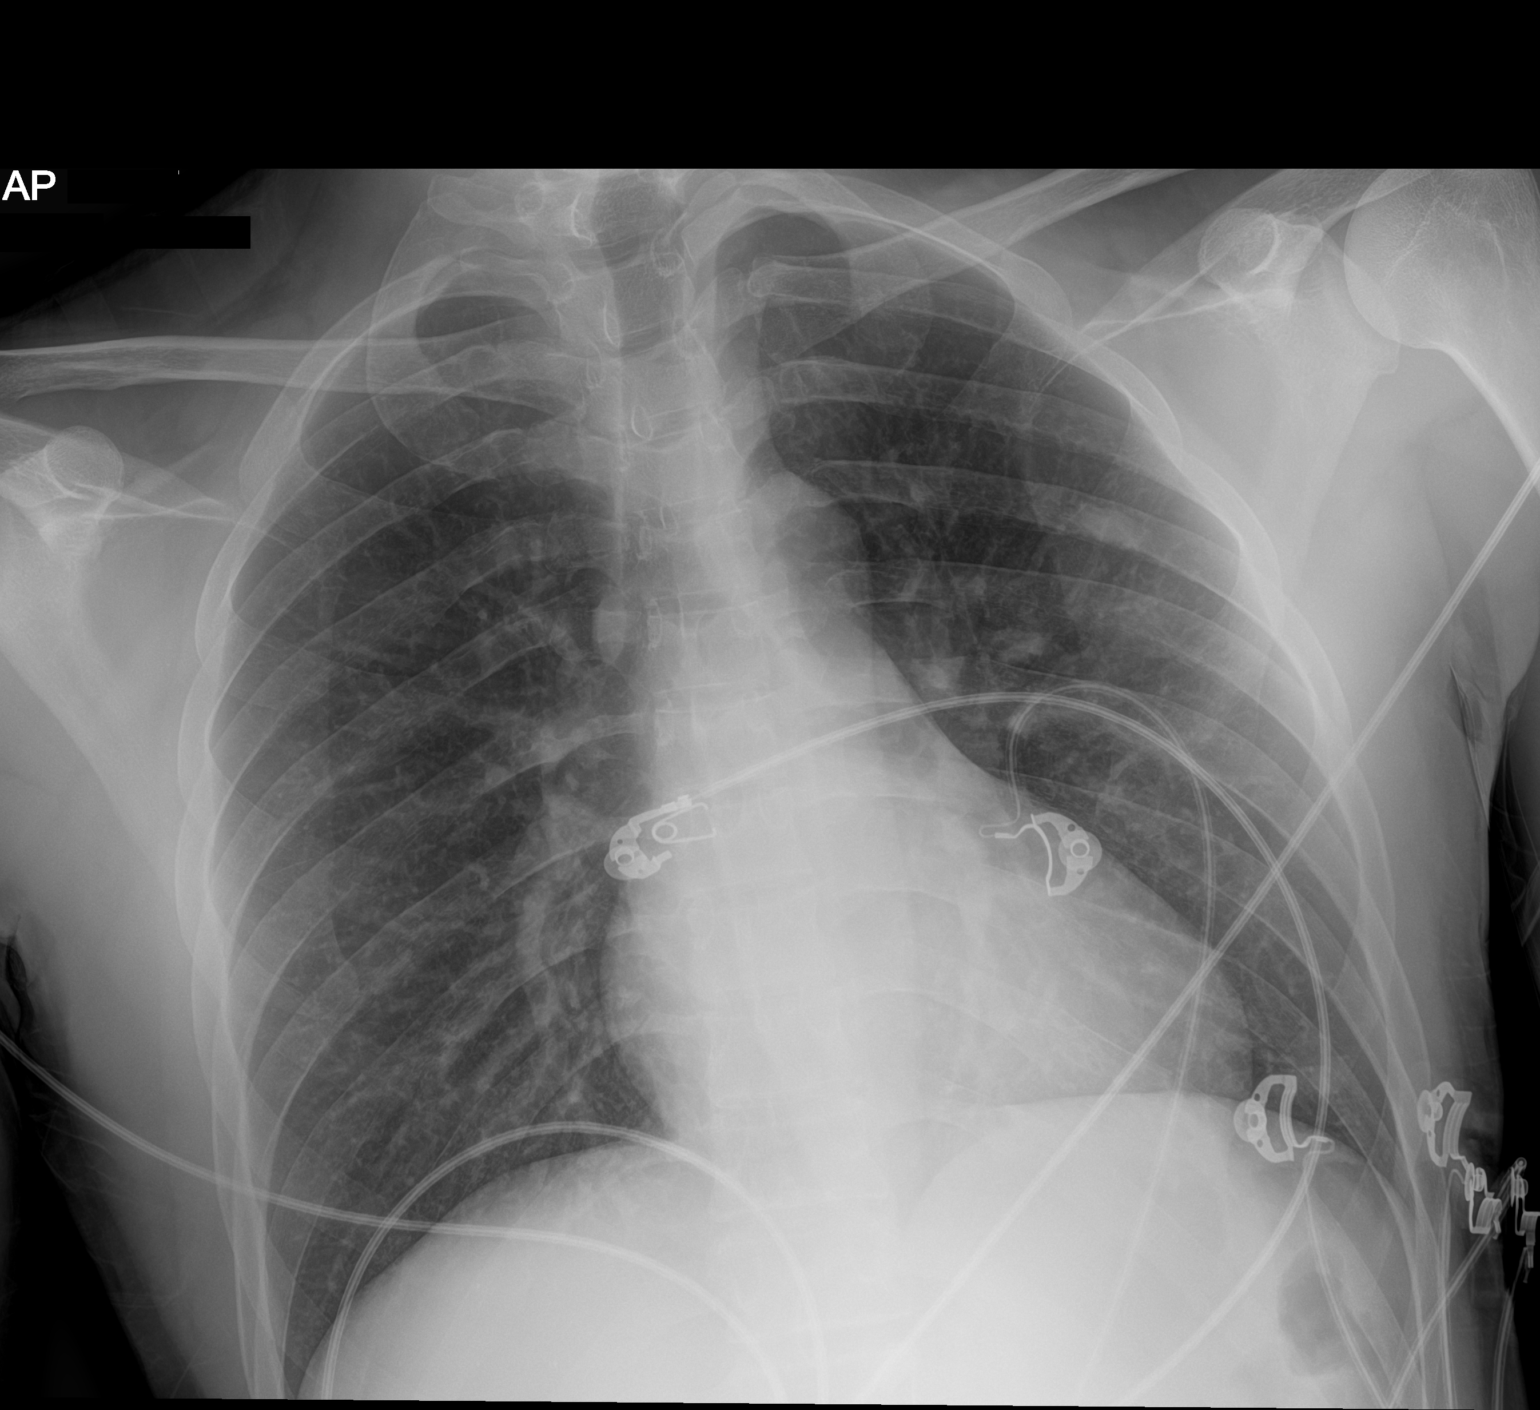

[1 of 1 positions shown; findings below may reference images not displayed]

FINDINGS: The lungs are clear. There is no pleural effusion or pneumothorax.
The cardiac silhouette is within normal limits. No acute osseous
pathology.
IMPRESSION: No active disease.

## 2023-01-07 ENCOUNTER — Ambulatory Visit (HOSPITAL_COMMUNITY)
Admission: EM | Admit: 2023-01-07 | Discharge: 2023-01-07 | Disposition: A | Discharge status: Home / Self Care | Payer: Medicaid Other | Attending: Behavioral Health | Admitting: Behavioral Health

## 2023-01-07 ENCOUNTER — Encounter (HOSPITAL_COMMUNITY): Payer: Self-pay | Admitting: Behavioral Health

## 2023-01-07 DIAGNOSIS — F3130 Bipolar disorder, current episode depressed, mild or moderate severity, unspecified: Secondary | ICD-10-CM | POA: Insufficient documentation
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
## 2023-01-07 NOTE — ED Notes (Signed)
Patient A&O x 4, ambulatory. Patient discharged in no acute distress. Patient denied SI/HI, A/VH upon discharge. Patient verbalized understanding of all discharge instructions explained by staff, to include follow up appointments, s and safety plan. Patient reported mood 10/10.  Pt belongings returned to patient from orange locker. intact. Patient escorted to lobby via staff for transport to destination. Safety maintained.

## 2023-01-07 NOTE — Discharge Instructions (Addendum)
*To find therapists and psychiatrists outside of [facility], please go to [email or web address] and type [facility]: Outpatient psychiatric Services:   Please see the walk in hours listed below.  Medication Management New Patient needing Medication Management Walk-in, and Existing Patients needing to see a provider for management coming as a walk in   Monday thru Friday 8:00 AM first come first serve until slots are full.  Recommend being there by 7:15 AM to ensure a slot is open.  Therapy New Patient Therapy Intake and Existing Patients needing to see therapist coming in as a walk in.   Monday, Wednesday, and Thursday morning at 8:00 am first come first serve.  Recommend being there by 7:15 AM to ensure a slot is open.    Every 1st, 2nd, and 3rd Friday at 1:00 PM first come first serve until slots are full.  Will still need to come in that morning at 7:15 AM to get registered for an afternoon slot.  For all walk-ins we ask that you arrive by 7:15 am because patients will be seen in there order of arrival (FIRST COME FIRST SERVE) Availability is limited, therefore you may not be seen on the same day that you walk in if all slots are full.    Our goal is to serve and meet the needs of our community to the best of our ability.     Based on what you have shared, a list of resources for outpatient therapy and psychiatry is provided below to get you started back on treatment.  It is imperative that you follow through with treatment within 5-7 days from the day of discharge to prevent any further risk to your safety or mental well-being.  You are not limited to the list provided.  In case of an urgent crisis, you may contact the Mobile Crisis Unit with [facility] at [phone].        Outpatient Services for Therapy and Medication Management for [facility] [address], [place],  [address] [phone] phone  New Patient Assessment/Therapy Walk-ins Monday and Wednesday: 8am until slots are full. Every 1st and 2nd Friday: 1pm - 5pm  NO ASSESSMENT/THERAPY WALK-INS ON TUESDAYS OR THURSDAYS  New Patient Psychiatry/Medication Management Walk-ins Monday-Friday: 8am-11am  For all walk-ins, we ask that you arrive by 7:30am because patient will be seen in the order of arrival.  Availability is limited; therefore, you may not be seen on the same day that you walk-in.  Our goal is to serve and meet the needs of our community to the best of our ability.   [facility] [address] [place], [place], [address] [phone] phone  [facility] [address] [place], [place], [address] [phone] phone [phone] phone ([address], Anthem/Elevance, [address], [address], [address], [address], Healthy [place], [place], [place], [address], [employer], [place], UHC, [employer], [place], Out of Network)  [employer] [place] [address] [place], [place], [address] [phone] phone ([place], Anthem/Elevance, [employer] Options/Carelon, BCBS, [address], [place], [place], [place], [place], [employer], [place], [place], [place], [facility])  [employer] [address] [place], [place], [address] [phone] phone (Medicaid, ask about other insurance)  [facility] [address] [place], [place], [address] [phone] phone [phone] fax ([address] [place] , [place], [place], [place] Health Choice, UHC, TRICARE, Self-Pay)  [facility] [address] [place], [place], [address] [phone] phone ([address], Anthem/Elevance, [address], [address], [place], [employer], [place], [place], [place], [employer], [place], [place], [place], [facility])  [facility] - 6-8 MONTH WAIT FOR THERAPY; SOONER FOR  MEDICATION MANAGEMENT [address], [address] [place], [place], [address] [phone] phone ([address] [employer] [address] - , [address], [place], [place], Friday Health Plans, [address], BCBS Healthy [place], [place], [address], [place], [place], [place], [place], Tricare, UHC, [employer], [place])  [facility] [address], [address] [place], [place], [address] [phone] phone  [facility] [address], [address] [place], [place], [address] [phone] phone  [facility] [address], [address] [place], [place], [address] [phone] phone  [facility] [place] - THERAPY ONLY [address] [place], [address] [phone] phone  [facility], [place] [address], [place], [address] [phone] phone  [facility] [address] [place], [place], [address] [phone] phone [phone] fax  [facility] [address] [place], [place], [address] [phone] phone [phone] fax  [facility] [phone] [address] [place], [place], [address] [phone] phone  [facility]ovita Kussmaul [address] [place], [place], [address]  [phone] phone  [facility]  (Adults Only) [address] [place], [place], [address]  [phone] phone [phone] fax
